# Patient Record
Sex: Female | Born: 1960 | Race: White | Hispanic: No | Marital: Married | Smoking: Never smoker
Health system: Southern US, Community
[De-identification: ages and names within clinical notes are randomized; demographics above are authoritative.]

## PROBLEM LIST (undated history)

## (undated) DIAGNOSIS — E039 Hypothyroidism, unspecified: Secondary | ICD-10-CM

## (undated) DIAGNOSIS — E038 Other specified hypothyroidism: Secondary | ICD-10-CM

## (undated) DIAGNOSIS — E538 Deficiency of other specified B group vitamins: Secondary | ICD-10-CM

## (undated) DIAGNOSIS — E7800 Pure hypercholesterolemia, unspecified: Secondary | ICD-10-CM

## (undated) DIAGNOSIS — D649 Anemia, unspecified: Secondary | ICD-10-CM

## (undated) DIAGNOSIS — M8580 Other specified disorders of bone density and structure, unspecified site: Secondary | ICD-10-CM

## (undated) DIAGNOSIS — G43909 Migraine, unspecified, not intractable, without status migrainosus: Secondary | ICD-10-CM

## (undated) DIAGNOSIS — I6529 Occlusion and stenosis of unspecified carotid artery: Secondary | ICD-10-CM

## (undated) DIAGNOSIS — J309 Allergic rhinitis, unspecified: Secondary | ICD-10-CM

## (undated) DIAGNOSIS — B009 Herpesviral infection, unspecified: Secondary | ICD-10-CM

## (undated) HISTORY — DX: Pure hypercholesterolemia, unspecified: E78.00

## (undated) HISTORY — DX: Anemia, unspecified: D64.9

## (undated) HISTORY — DX: Herpesviral infection, unspecified: B00.9

## (undated) HISTORY — DX: Other specified disorders of bone density and structure, unspecified site: M85.80

## (undated) HISTORY — DX: Migraine, unspecified, not intractable, without status migrainosus: G43.909

## (undated) HISTORY — DX: Other specified hypothyroidism: E03.8

## (undated) HISTORY — PX: MANDIBLE FRACTURE SURGERY: SHX706

## (undated) HISTORY — DX: Allergic rhinitis, unspecified: J30.9

## (undated) HISTORY — DX: Occlusion and stenosis of unspecified carotid artery: I65.29

## (undated) HISTORY — DX: Deficiency of other specified B group vitamins: E53.8

---
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
## 1998-07-16 ENCOUNTER — Other Ambulatory Visit: Admission: RE | Admit: 1998-07-16 | Discharge: 1998-07-16 | Payer: Self-pay | Admitting: Gynecology

## 1999-10-21 ENCOUNTER — Ambulatory Visit (HOSPITAL_COMMUNITY): Admission: RE | Admit: 1999-10-21 | Discharge: 1999-10-21 | Payer: Self-pay | Admitting: Obstetrics and Gynecology

## 1999-10-21 ENCOUNTER — Encounter: Payer: Self-pay | Admitting: Obstetrics and Gynecology

## 2000-12-16 ENCOUNTER — Other Ambulatory Visit: Admission: RE | Admit: 2000-12-16 | Discharge: 2000-12-16 | Payer: Self-pay | Admitting: Obstetrics and Gynecology

## 2003-11-21 ENCOUNTER — Ambulatory Visit (HOSPITAL_COMMUNITY): Admission: RE | Admit: 2003-11-21 | Discharge: 2003-11-21 | Payer: Self-pay | Admitting: Obstetrics and Gynecology

## 2003-11-27 ENCOUNTER — Other Ambulatory Visit: Admission: RE | Admit: 2003-11-27 | Discharge: 2003-11-27 | Payer: Self-pay | Admitting: Obstetrics and Gynecology

## 2004-11-27 ENCOUNTER — Other Ambulatory Visit: Admission: RE | Admit: 2004-11-27 | Discharge: 2004-11-27 | Payer: Self-pay | Admitting: Obstetrics and Gynecology

## 2005-10-02 ENCOUNTER — Encounter: Admission: RE | Admit: 2005-10-02 | Discharge: 2005-10-02 | Payer: Self-pay | Admitting: Obstetrics and Gynecology

## 2005-11-28 ENCOUNTER — Other Ambulatory Visit: Admission: RE | Admit: 2005-11-28 | Discharge: 2005-11-28 | Payer: Self-pay | Admitting: Obstetrics and Gynecology

## 2007-01-05 ENCOUNTER — Other Ambulatory Visit: Admission: RE | Admit: 2007-01-05 | Discharge: 2007-01-05 | Payer: Self-pay | Admitting: Obstetrics and Gynecology

## 2007-01-11 ENCOUNTER — Ambulatory Visit (HOSPITAL_COMMUNITY): Admission: RE | Admit: 2007-01-11 | Discharge: 2007-01-11 | Payer: Self-pay | Admitting: Obstetrics and Gynecology

## 2008-01-04 ENCOUNTER — Ambulatory Visit: Payer: Self-pay | Admitting: Obstetrics and Gynecology

## 2008-01-10 ENCOUNTER — Ambulatory Visit: Payer: Self-pay | Admitting: Obstetrics and Gynecology

## 2008-01-10 ENCOUNTER — Other Ambulatory Visit: Admission: RE | Admit: 2008-01-10 | Discharge: 2008-01-10 | Payer: Self-pay | Admitting: Obstetrics and Gynecology

## 2008-01-10 ENCOUNTER — Encounter: Payer: Self-pay | Admitting: Obstetrics and Gynecology

## 2008-02-16 ENCOUNTER — Ambulatory Visit: Payer: Self-pay | Admitting: Cardiology

## 2008-06-28 ENCOUNTER — Encounter (INDEPENDENT_AMBULATORY_CARE_PROVIDER_SITE_OTHER): Payer: Self-pay | Admitting: *Deleted

## 2009-07-06 ENCOUNTER — Encounter: Admission: RE | Admit: 2009-07-06 | Discharge: 2009-07-06 | Payer: Self-pay | Admitting: Obstetrics and Gynecology

## 2009-07-31 ENCOUNTER — Encounter: Payer: Self-pay | Admitting: Cardiology

## 2009-07-31 ENCOUNTER — Other Ambulatory Visit: Admission: RE | Admit: 2009-07-31 | Discharge: 2009-07-31 | Payer: Self-pay | Admitting: Obstetrics and Gynecology

## 2009-07-31 ENCOUNTER — Ambulatory Visit: Payer: Self-pay | Admitting: Obstetrics and Gynecology

## 2009-09-04 ENCOUNTER — Encounter (INDEPENDENT_AMBULATORY_CARE_PROVIDER_SITE_OTHER): Payer: Self-pay | Admitting: *Deleted

## 2009-09-04 ENCOUNTER — Ambulatory Visit: Payer: Self-pay | Admitting: Obstetrics and Gynecology

## 2009-09-06 ENCOUNTER — Ambulatory Visit: Payer: Self-pay | Admitting: Internal Medicine

## 2009-09-18 ENCOUNTER — Ambulatory Visit: Payer: Self-pay | Admitting: Internal Medicine

## 2010-03-12 NOTE — Letter (Signed)
Summary: [facility] Instructions  [facility]  [address] [place], [place] [address]   Phone: [phone]  Fax: [phone]       Linda Mccormick    [birth year]    MRN: [id number]       Procedure Day /Date:  Tuesday 09/18/2009     Arrival Time: 9:30 am     Procedure Time: 10:30 am     Location of Procedure:                    _x _  [facility] (4th Floor)    PREPARATION FOR COLONOSCOPY WITH MIRALAX  Starting 5 days prior to your procedure Thursday 8/4 do not eat nuts, seeds, popcorn, corn, beans, peas,  salads, or any raw vegetables.  Do not take any fiber supplements (e.g. Metamucil, Citrucel, and Benefiber). ____________________________________________________________________________________________________   THE DAY BEFORE YOUR PROCEDURE         DATE: Monday 8/8  1   Drink clear liquids the entire day-NO SOLID FOOD  2   Do not drink anything colored red or purple.  Avoid juices with pulp.  No orange juice.  3   Drink at least 64 oz. (8 glasses) of fluid/clear liquids during the day to prevent dehydration and help the prep work efficiently.  CLEAR LIQUIDS INCLUDE: Water Jello Ice Popsicles Tea (sugar ok, no milk/cream) Powdered fruit flavored drinks Coffee (sugar ok, no milk/cream) Gatorade Juice: apple, white grape, white cranberry  Lemonade Clear bullion, consomm, broth Carbonated beverages (any kind) Strained chicken noodle soup Hard Candy  4   Mix the entire bottle of Miralax with 64 oz. of Gatorade/Powerade in the morning and put in the refrigerator to chill.  5   At 3:00 pm take 2 Dulcolax/Bisacodyl tablets.  6   At 4:30 pm take one Reglan/Metoclopramide tablet.  7  Starting at 5:00 pm drink one 8 oz glass of the Miralax mixture every 15-20 minutes until you have finished drinking the entire 64 oz.  You should finish drinking prep around 7:30 or 8:00 pm.  8   If you are nauseated, you may take the 2nd Reglan/Metoclopramide  tablet at 6:30 pm.        9    At 8:00 pm take 2 more DULCOLAX/Bisacodyl tablets.     THE DAY OF YOUR PROCEDURE      DATE:  Tuesday 8/9  You may drink clear liquids until 8:30 am  (2 HOURS BEFORE PROCEDURE).   MEDICATION INSTRUCTIONS  Unless otherwise instructed, you should take regular prescription medications with a small sip of water as early as possible the morning of your procedure.           OTHER INSTRUCTIONS  You will need a responsible adult at least 52 years of age to accompany you and drive you home.   This person must remain in the waiting room during your procedure.  Wear loose fitting clothing that is easily removed.  Leave jewelry and other valuables at home.  However, you may wish to bring a book to read or an iPod/MP3 player to listen to music as you wait for your procedure to start.  Remove all body piercing jewelry and leave at home.  Total time from sign-in until discharge is approximately 2-3 hours.  You should go home directly after your procedure and rest.  You can resume normal activities the day after your procedure.  The day of your procedure you should not:   Drive  Make legal decisions   Operate machinery   Drink alcohol   Return to work  You will receive specific instructions about eating, activities and medications before you leave.   The above instructions have been reviewed and explained to me by   Ezra Sites RN  September 06, 2009 8:01 AM     I fully understand and can verbalize these instructions _____________________________ Date _______

## 2010-03-12 NOTE — Procedures (Signed)
Summary: Colonoscopy  Patient: Linda Mccormick Note: All result statuses are Final unless otherwise noted.  Tests: (1) Colonoscopy (COL)   COL Colonoscopy           DONE     [facility]     [address]     [place], [place]  [address]           COLONOSCOPY PROCEDURE REPORT           PATIENT:  Linda, Mccormick  MR#:  [id number]     BIRTHDATE:  08/26/1960, 48 yrs. old  GENDER:  female     ENDOSCOPIST:  Hedwig Morton. Juanda Chance, MD     REF. BY:  Edyth Gunnels, M.D.     PROCEDURE DATE:  09/18/2009     PROCEDURE:  Colonoscopy [address]     ASA CLASS:  Class I     INDICATIONS:  Routine Risk Screening     MEDICATIONS:   Versed 8 mg, Fentanyl 75 mcg           DESCRIPTION OF PROCEDURE:   After the risks benefits and     alternatives of the procedure were thoroughly explained, informed     consent was obtained.  Digital rectal exam was performed and     revealed no rectal masses.   The  endoscope was introduced through     the anus and advanced to the cecum, which was identified by both     the appendix and ileocecal valve, without limitations.  The     quality of the prep was good, using MiraLax.  The instrument was     then slowly withdrawn as the colon was fully examined.     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  This was     otherwise a normal examination of the colon (see image4, image3,     image2, and image1).   Retroflexed views in the rectum revealed no     abnormalities.    The scope was then withdrawn from the patient     and the procedure completed.           COMPLICATIONS:  None     ENDOSCOPIC IMPRESSION:     1) Melanosis     2) Otherwise normal examination     RECOMMENDATIONS:     1) high fiber diet     REPEAT EXAM:  In 10 year(s) for.           ______________________________     Hedwig Morton. Juanda Chance, MD           CC:           n.     eSIGNED:   Hedwig Morton. Brodie at 09/18/2009 11:42 AM           Tyrone Sage,  [id number]  Note: An exclamation mark (!) indicates a result that was not dispersed into the flowsheet. Document Creation Date: 09/18/2009 11:42 AM _______________________________________________________________________  (1) Order result status: Final Collection or observation date-time: 09/18/2009 11:33 Requested date-time:  Receipt date-time:  Reported date-time:  Referring Physician:   Ordering Physician: Lina Sar [phone]) Specimen Source:  Source: Launa Grill Order Number: [id number] Lab site:   Appended Document: Colonoscopy    Clinical Lists Changes  Observations: Added new observation of COLONNXTDUE: 09/2019 (09/18/2009 14:05)

## 2010-03-12 NOTE — Miscellaneous (Signed)
Summary: [facility]  Clinical Lists Changes  Medications: Added new medication of MIRALAX   POWD (POLYETHYLENE GLYCOL 3350) As per prep  instructions. - Signed Added new medication of DULCOLAX 5 MG  TBEC (BISACODYL) Day before procedure take 2 at 3pm and 2 at 8pm. - Signed Added new medication of REGLAN 10 MG  TABS (METOCLOPRAMIDE HCL) As per prep instructions. - Signed Rx of MIRALAX   POWD (POLYETHYLENE GLYCOL 3350) As per prep  instructions.;  #255gm x 0;  Signed;  Entered by: Ezra Sites RN;  Authorized by: Hart Carwin MD;  Method used: Electronically to [facility] [id number]*, [address], [place], [place], [place]  [address], Ph: [phone] or [phone], Fax: [phone] Rx of DULCOLAX 5 MG  TBEC (BISACODYL) Day before procedure take 2 at 3pm and 2 at 8pm.;  #4 x 0;  Signed;  Entered by: Ezra Sites RN;  Authorized by: Hart Carwin MD;  Method used: Electronically to [facility] [id number]*, [address], [place], [place], [place]  [address], Ph: [phone] or [phone], Fax: [phone] Rx of REGLAN 10 MG  TABS (METOCLOPRAMIDE HCL) As per prep instructions.;  #2 x 0;  Signed;  Entered by: Ezra Sites RN;  Authorized by: Hart Carwin MD;  Method used: Electronically to [facility] [id number]*, [address], [place], [place], [place]  [address], Ph: [phone] or [phone], Fax: [phone] Observations: Added new observation of NKA: T (09/06/2009 7:40)    Prescriptions: REGLAN 10 MG  TABS (METOCLOPRAMIDE HCL) As per prep instructions.  #2 x 0   Entered by:   Ezra Sites RN   Authorized by:   Hart Carwin MD   Signed by:   Ezra Sites RN on 09/06/2009   Method used:   Electronically to        [facility] [address] [phone]* (retail)       [address]       [place], [place]  [address]       Ph: [phone] or [phone]       Fax: [phone]   RxID:   [id number] DULCOLAX 5 MG   TBEC (BISACODYL) Day before procedure take 2 at 3pm and 2 at 8pm.  #4 x 0   Entered by:   Ezra Sites RN   Authorized by:   Hart Carwin MD   Signed by:   Ezra Sites RN on 09/06/2009   Method used:   Electronically to        [facility] [address] [phone]* (retail)       [address]       [place], [place]  [address]       Ph: [phone] or [phone]       Fax: [phone]   RxID:   [id number] MIRALAX   POWD (POLYETHYLENE GLYCOL 3350) As per prep  instructions.  #255gm x 0   Entered by:   Ezra Sites RN   Authorized by:   Hart Carwin MD   Signed by:   Ezra Sites RN on 09/06/2009   Method used:   Electronically to        [facility] [address] [phone]* (retail)       [address]       [place], [place]  [address]       Ph: [phone] or [phone]       Fax: [phone]   RxID:   [id number]

## 2010-03-14 ENCOUNTER — Ambulatory Visit (INDEPENDENT_AMBULATORY_CARE_PROVIDER_SITE_OTHER): Payer: Federal, State, Local not specified - PPO | Admitting: Women's Health

## 2010-03-14 DIAGNOSIS — N766 Ulceration of vulva: Secondary | ICD-10-CM

## 2010-06-25 NOTE — Assessment & Plan Note (Signed)
[facility]                            CARDIOLOGY OFFICE NOTE   MELAYA, HOSELTON                     MRN:          [id number]  DATE:02/16/2008                            DOB:          07/24/1960    I was asked by Dr. Edyth Gunnels to evaluate Linda Mccormick for  hyperlipidemia and family history of coronary disease.  I have taken  care of her mother.   HISTORY OF PRESENT ILLNESS:  She is 50 years of age, extremely health  conscious married white female whose most recent cholesterol was 268,  triglycerides 163, LDL was 168, HDL however was quite good at 68 with a  total cholesterol to HDL ratio of 3.9.  Her diet is remarkably good and  she rarely eats meat and has even stopped eating chicken because of the  fear of the fat.   She does yoga 3-4 times a week and cardio 1-2 times a week.  She really  does not enjoy the cardiac.  She would like to know if she can just  walk.   Her family history is really not significant and that her mother had a  heart attack in her 69s.  Her mother also had other risk factors.   PAST MEDICAL HISTORY:  She has no known dye allergies.  She has known no  known allergies to medications.   She is currently on imipramine 100 mg nightly to increase her serotonin  levels as an antidepressant, multivitamin, vitamin D, fish oil, B12  daily.  She takes Treximet 85/500 p.r.n. for migraines.  This is fairly  infrequent.   She does not smoke.  She has about 1 glass of wine a week.  She drinks  very little caffeine.   SURGICAL HISTORY:  Jaw surgery about 18 years ago.   FAMILY HISTORY:  As above.   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  It is a very large job and her recent promotion.  She really is  careful with her diet.  She is married.   REVIEW OF SYSTEMS:  Other than the HPI, she has some history of anemia  and some fatigue.  Other review of systems questioned are all  negative.   PHYSICAL EXAMINATION:  VITAL SIGNS:  She does not list a height but she  weighs 121 pounds.  Her blood pressure is 130/80, her pulse is 78 and  regular.  EKG from the outside has apparently been normal.  We did not  repeat one today.  HEENT:  Normal.  NECK:  Carotid upstrokes were equal bilateral bruits.  No JVD.  Thyroid  is not enlarged.  Trachea is midline.  LUNGS:  Clear to auscultation and percussion.  HEART:  Nondisplaced PMI.  Normal S1 and S2.  No gallop, rub, or murmur.  ABDOMEN:  Soft.  No pulsatile mass.  She does have a pulsation, but it  is narrow and she is thin.  There is no thyromegaly.  EXTREMITIES:  There was no cyanosis, clubbing, or edema.  Pulses are  brisk.  NEUROLOGIC:  Intact.  MUSCULOSKELETAL:  Intact.  SKIN:  Unremarkable.   ASSESSMENT:  1. Mixed hyperlipidemia.  She is blessed with a good HDL level and a      relatively very good cholesterol to HDL ratio.  I suspect her      triglycerides are elevated because she eats a lot of fruit and      carbohydrates in general.  2. No true premature family history of coronary disease.   I had a long talk with Kailena today.  I have recommended no  pharmacological therapy at the present time.  I have recommended that I  see her back in 3-5 years just as a followup to see if the guidelines  have changed or if her numbers have worsened.   I have advised her to walk briskly 3 hours a week.   I told her to make sure that her fish oil is at least 1000 mg a day.     Thomas C. Daleen Squibb, MD, [facility]  Electronically Signed    TCW/MedQ  DD: 02/16/2008  DT: 02/17/2008  Job #: [id number]   cc:   Reuel Boom L. Eda Paschal, M.D.  Soyla Murphy. Renne Crigler, M.D.

## 2010-09-10 ENCOUNTER — Other Ambulatory Visit: Payer: Self-pay | Admitting: Obstetrics and Gynecology

## 2010-09-10 DIAGNOSIS — Z1231 Encounter for screening mammogram for malignant neoplasm of breast: Secondary | ICD-10-CM

## 2010-09-11 ENCOUNTER — Encounter: Payer: Self-pay | Admitting: Obstetrics and Gynecology

## 2010-09-11 ENCOUNTER — Encounter: Payer: Self-pay | Admitting: Gynecology

## 2010-09-11 ENCOUNTER — Ambulatory Visit (INDEPENDENT_AMBULATORY_CARE_PROVIDER_SITE_OTHER): Payer: Federal, State, Local not specified - PPO | Admitting: Obstetrics and Gynecology

## 2010-09-11 ENCOUNTER — Other Ambulatory Visit (HOSPITAL_COMMUNITY)
Admission: RE | Admit: 2010-09-11 | Discharge: 2010-09-11 | Disposition: A | Discharge status: Home / Self Care | Payer: Federal, State, Local not specified - PPO | Source: Ambulatory Visit | Attending: Obstetrics and Gynecology | Admitting: Obstetrics and Gynecology

## 2010-09-11 DIAGNOSIS — Z0000 Encounter for general adult medical examination without abnormal findings: Secondary | ICD-10-CM

## 2010-09-11 DIAGNOSIS — Z01419 Encounter for gynecological examination (general) (routine) without abnormal findings: Secondary | ICD-10-CM | POA: Insufficient documentation

## 2010-09-11 DIAGNOSIS — R82998 Other abnormal findings in urine: Secondary | ICD-10-CM

## 2010-09-11 DIAGNOSIS — B009 Herpesviral infection, unspecified: Secondary | ICD-10-CM

## 2010-09-11 MED ORDER — VALACYCLOVIR HCL 500 MG PO TABS
500.0000 mg | ORAL_TABLET | Freq: Two times a day (BID) | ORAL | Status: AC
Start: 2010-09-11 — End: 2010-09-16

## 2010-09-11 NOTE — Progress Notes (Signed)
The patient came to see me today for an annual gynecological exam. She is not having any menopausal symptoms. She is scheduled for yearly mammogram. 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 In reviewing her history it appears that she got it from a colleague who had an active fever blister and handled her phone leaving moisture on the phone.she does have a husband who occasionally gets fever blisters. The history is not consistent with her getting up from him however on her buttock. Obviously there is some chance that he was shedding virus at a different time. Chronological he works better that she got from her Colleague.  Physical examination: HEENT within normal limits. Neck: Thyroid not large. No masses. Supraclavicular nodes: not enlarged. Breasts: Examined in both sitting midline position. No skin changes and no masses. Abdomen: Soft no guarding rebound or masses or hernia. Pelvic: External: Within normal limits. BUS: Within normal limits. Vaginal:within normal limits. Good estrogen effect. No evidence of cystocele rectocele or enterocele. Cervix: clean. Uterus: Normal size and shape. Adnexa: No masses. Rectovaginal exam: Confirmatory and negative. Extremities: Within normal limits.  Assessment: 1. HSV 1  Plan: She will call on a when necessary basis for treatment for HSV 1. She knows that she can take it daily if she gets a lot reoccurrences. So far this is not an issue. I gave her written prescription today for Valtrex 500 mg 1 twice a day for recurrence. She was given 30 pills.she is being treated for her cholesterol by her PCP with good results.

## 2010-09-11 NOTE — Progress Notes (Signed)
Addended byCammie Mcgee T on: 09/11/2010 03:12 PM   Modules accepted: Orders

## 2010-09-18 ENCOUNTER — Ambulatory Visit (HOSPITAL_COMMUNITY)
Admission: RE | Admit: 2010-09-18 | Discharge: 2010-09-18 | Disposition: A | Discharge status: Home / Self Care | Payer: Federal, State, Local not specified - PPO | Source: Ambulatory Visit | Attending: Obstetrics and Gynecology | Admitting: Obstetrics and Gynecology

## 2010-09-18 DIAGNOSIS — Z1231 Encounter for screening mammogram for malignant neoplasm of breast: Secondary | ICD-10-CM | POA: Insufficient documentation

## 2011-04-10 ENCOUNTER — Other Ambulatory Visit: Payer: Self-pay | Admitting: Dermatology

## 2012-02-11 DIAGNOSIS — M8580 Other specified disorders of bone density and structure, unspecified site: Secondary | ICD-10-CM

## 2012-02-11 HISTORY — DX: Other specified disorders of bone density and structure, unspecified site: M85.80

## 2012-02-13 ENCOUNTER — Other Ambulatory Visit: Payer: Self-pay | Admitting: Gynecology

## 2012-02-13 DIAGNOSIS — Z1231 Encounter for screening mammogram for malignant neoplasm of breast: Secondary | ICD-10-CM

## 2012-02-17 ENCOUNTER — Ambulatory Visit (HOSPITAL_COMMUNITY)
Admission: RE | Admit: 2012-02-17 | Discharge: 2012-02-17 | Disposition: A | Discharge status: Home / Self Care | Payer: Federal, State, Local not specified - PPO | Source: Ambulatory Visit | Attending: Gynecology | Admitting: Gynecology

## 2012-02-17 DIAGNOSIS — Z1231 Encounter for screening mammogram for malignant neoplasm of breast: Secondary | ICD-10-CM | POA: Insufficient documentation

## 2012-02-20 ENCOUNTER — Encounter: Payer: Self-pay | Admitting: Gynecology

## 2012-02-20 ENCOUNTER — Ambulatory Visit (INDEPENDENT_AMBULATORY_CARE_PROVIDER_SITE_OTHER): Payer: Federal, State, Local not specified - PPO | Admitting: Gynecology

## 2012-02-20 VITALS — BP 120/74 | Ht 63.75 in | Wt 112.0 lb

## 2012-02-20 DIAGNOSIS — E7800 Pure hypercholesterolemia, unspecified: Secondary | ICD-10-CM | POA: Insufficient documentation

## 2012-02-20 DIAGNOSIS — Z01419 Encounter for gynecological examination (general) (routine) without abnormal findings: Secondary | ICD-10-CM

## 2012-02-20 DIAGNOSIS — G43909 Migraine, unspecified, not intractable, without status migrainosus: Secondary | ICD-10-CM | POA: Insufficient documentation

## 2012-02-20 DIAGNOSIS — N951 Menopausal and female climacteric states: Secondary | ICD-10-CM

## 2012-02-20 NOTE — Patient Instructions (Addendum)
Tetanus, Diphtheria, Pertussis (Tdap) Vaccine What You Need to Know WHY GET VACCINATED? Tetanus, diphtheria and pertussis can be very serious diseases, even for adolescents and adults. Tdap vaccine can protect us from these diseases. TETANUS (Lockjaw) causes painful muscle tightening and stiffness, usually all over the body.  It can lead to tightening of muscles in the head and neck so you can't open your mouth, swallow, or sometimes even breathe. Tetanus kills about 1 out of 5 people who are infected. DIPHTHERIA can cause a thick coating to form in the back of the throat.  It can lead to breathing problems, paralysis, heart failure, and death. PERTUSSIS (Whooping Cough) causes severe coughing spells, which can cause difficulty breathing, vomiting and disturbed sleep.  It can also lead to weight loss, incontinence, and rib fractures. Up to 2 in 100 adolescents and 5 in 100 adults with pertussis are hospitalized or have complications, which could include pneumonia and death. These diseases are caused by bacteria. Diphtheria and pertussis are spread from person to person through coughing or sneezing. Tetanus enters the body through cuts, scratches, or wounds. Before vaccines, the [place] saw as many as 200,000 cases a year of diphtheria and pertussis, and hundreds of cases of tetanus. Since vaccination began, tetanus and diphtheria have dropped by about 99% and pertussis by about 80%. TDAP VACCINE Tdap vaccine can protect adolescents and adults from tetanus, diphtheria, and pertussis. One dose of Tdap is routinely given at age 11 or 12. People who did not get Tdap at that age should get it as soon as possible. Tdap is especially important for health care professionals and anyone having close contact with a baby younger than 12 months. Pregnant women should get a dose of Tdap during every pregnancy, to protect the newborn from pertussis. Infants are most at risk for severe, life-threatening  complications from pertussis. A similar vaccine, called Td, protects from tetanus and diphtheria, but not pertussis. A Td booster should be given every 10 years. Tdap may be given as one of these boosters if you have not already gotten a dose. Tdap may also be given after a severe cut or burn to prevent tetanus infection. Your doctor can give you more information. Tdap may safely be given at the same time as other vaccines. SOME PEOPLE SHOULD NOT GET THIS VACCINE  If you ever had a life-threatening allergic reaction after a dose of any tetanus, diphtheria, or pertussis containing vaccine, OR if you have a severe allergy to any part of this vaccine, you should not get Tdap. Tell your doctor if you have any severe allergies.  If you had a coma, or long or multiple seizures within 7 days after a childhood dose of DTP or DTaP, you should not get Tdap, unless a cause other than the vaccine was found. You can still get Td.  Talk to your doctor if you:  have epilepsy or another nervous system problem,  had severe pain or swelling after any vaccine containing diphtheria, tetanus or pertussis,  ever had Guillain-Barr Syndrome (GBS),  aren't feeling well on the day the shot is scheduled. RISKS OF A VACCINE REACTION With any medicine, including vaccines, there is a chance of side effects. These are usually mild and go away on their own, but serious reactions are also possible. Brief fainting spells can follow a vaccination, leading to injuries from falling. Sitting or lying down for about 15 minutes can help prevent these. Tell your doctor if you feel dizzy or light-headed, or   have vision changes or ringing in the ears. Mild problems following Tdap (Did not interfere with activities)  Pain where the shot was given (about 3 in 4 adolescents or 2 in 3 adults)  Redness or swelling where the shot was given (about 1 person in 5)  Mild fever of at least 100.4F (up to about 1 in 25 adolescents or 1 in  100 adults)  Headache (about 3 or 4 people in 10)  Tiredness (about 1 person in 3 or 4)  Nausea, vomiting, diarrhea, stomach ache (up to 1 in 4 adolescents or 1 in 10 adults)  Chills, body aches, sore joints, rash, swollen glands (uncommon) Moderate problems following Tdap (Interfered with activities, but did not require medical attention)  Pain where the shot was given (about 1 in 5 adolescents or 1 in 100 adults)  Redness or swelling where the shot was given (up to about 1 in 16 adolescents or 1 in 25 adults)  Fever over 102F (about 1 in 100 adolescents or 1 in 250 adults)  Headache (about 3 in 20 adolescents or 1 in 10 adults)  Nausea, vomiting, diarrhea, stomach ache (up to 1 or 3 people in 100)  Swelling of the entire arm where the shot was given (up to about 3 in 100). Severe problems following Tdap (Unable to perform usual activities, required medical attention)  Swelling, severe pain, bleeding and redness in the arm where the shot was given (rare). A severe allergic reaction could occur after any vaccine (estimated less than 1 in a million doses). WHAT IF THERE IS A SERIOUS REACTION? What should I look for?  Look for anything that concerns you, such as signs of a severe allergic reaction, very high fever, or behavior changes. Signs of a severe allergic reaction can include hives, swelling of the face and throat, difficulty breathing, a fast heartbeat, dizziness, and weakness. These would start a few minutes to a few hours after the vaccination. What should I do?  If you think it is a severe allergic reaction or other emergency that can't wait, call 9-1-1 or get the person to the nearest hospital. Otherwise, call your doctor.  Afterward, the reaction should be reported to the "Vaccine Adverse Event Reporting System" (VAERS). Your doctor might file this report, or you can do it yourself through the VAERS web site at www.vaers.hhs.gov, or by calling 1-800-822-7967. VAERS is  only for reporting reactions. They do not give medical advice.  THE NATIONAL VACCINE INJURY COMPENSATION PROGRAM The National Vaccine Injury Compensation Program (VICP) is a federal program that was created to compensate people who may have been injured by certain vaccines. Persons who believe they may have been injured by a vaccine can learn about the program and about filing a claim by calling 1-800-338-2382 or visiting the VICP website at www.hrsa.gov/vaccinecompensation. HOW CAN I LEARN MORE?  Ask your doctor.  Call your local or state health department.  Contact the Centers for Disease Control and Prevention (CDC):  Call 1-800-232-4636 or visit CDC's website at www.cdc.gov/vaccines CDC Tdap Vaccine VIS (06/19/11) Document Released: 07/29/2011 Document Reviewed: 07/29/2011 [employer] Patient Information 2013 [employer].  Breast Self-Awareness Practicing breast self-awareness may pick up problems early, prevent significant medical complications, and possibly save your life. By practicing breast self-awareness, you can become familiar with how your breasts look and feel and if your breasts are changing. This allows you to notice changes early. It can also offer you some reassurance that your breast health is good. One way to   learn what is normal for your breasts and whether your breasts are changing is to do a breast self-exam. If you find a lump or something that was not present in the past, it is best to contact your caregiver right away. Other findings that should be evaluated by your caregiver include nipple discharge, especially if it is bloody; skin changes or reddening; areas where the skin seems to be pulled in (retracted); or new lumps and bumps. Breast pain is seldom associated with cancer (malignancy), but should also be evaluated by a caregiver. BREAST SELF-EXAM The best time to examine your breasts is 5 7 days after your menstrual period is over. During menstruation, the breasts  are lumpier, and it may be more difficult to pick up changes. If you do not menstruate, have reached menopause, or had your uterus removed (hysterectomy), you should examine your breasts at regular intervals, such as monthly. If you are breastfeeding, examine your breasts after a feeding or after using a breast pump. Breast implants do not decrease the risk for lumps or tumors, so continue to perform breast self-exams as recommended. Talk to your caregiver about how to determine the difference between the implant and breast tissue. Also, talk about the amount of pressure you should use during the exam. Over time, you will become more familiar with the variations of your breasts and more comfortable with the exam. A breast self-exam requires you to remove all your clothes above the waist.   Look at your breasts and nipples. Stand in front of a mirror in a room with good lighting. With your hands on your hips, push your hands firmly downward. Look for a difference in shape, contour, and size from one breast to the other (asymmetry). Asymmetry includes puckers, dips, or bumps. Also, look for skin changes, such as reddened or scaly areas on the breasts. Look for nipple changes, such as discharge, dimpling, repositioning, or redness.  Carefully feel your breasts. This is best done either in the shower or tub while using soapy water or when flat on your back. Place the arm (on the side of the breast you are examining) above your head. Use the pads (not the fingertips) of your three middle fingers on your opposite hand to feel your breasts. Start in the underarm area and use  inch (2 cm) overlapping circles to feel your breast. Use 3 different levels of pressure (light, medium, and firm pressure) at each circle before moving to the next circle. The light pressure is needed to feel the tissue closest to the skin. The medium pressure will help to feel breast tissue a little deeper, while the firm pressure is needed to  feel the tissue close to the ribs. Continue the overlapping circles, moving downward over the breast until you feel your ribs below your breast. Then, move one finger-width towards the center of the body. Continue to use the  inch (2 cm) overlapping circles to feel your breast as you move slowly up toward the collar bone (clavicle) near the base of the neck. Continue the up and down exam using all 3 pressures until you reach the middle of the chest. Do this with each breast, carefully feeling for lumps or changes.  Keep a written record with breast changes or normal findings for each breast. By writing this information down, you do not need to depend only on memory for size, tenderness, or location. Write down where you are in your menstrual cycle, if you are still menstruating.  Breast   tissue can have some lumps or thick tissue. However, see your caregiver if you find anything that concerns you.  SEEK MEDICAL CARE IF:  You see a change in shape, contour, or size of your breasts or nipples.   You see skin changes, such as reddened or scaly areas on the breasts or nipples.   You have an unusual discharge from your nipples.   You feel a new lump or unusually thick areas.  Document Released: 01/27/2005 Document Revised: 07/29/2011 Document Reviewed: 05/14/2011 ExitCare Patient Information 2013 [employer].   

## 2012-02-20 NOTE — Progress Notes (Signed)
Linda Mccormick [birth year] [id number]   History:    52 y.o.  for annual gyn exam with no complaints today. Patient is postmenopausal. She stated that she became menopausal in her late 56s. She's having no menopausal symptoms. 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 Patient denied any recurrences. Patient been followed by Dr.Pang who has been monitoring her hypercholesterolemia and doing her lab work. Her last mammogram was a few weeks ago which was normal. She in frequently does her self breast examination. Patient with no prior history of abnormal Pap smears. Patient with benign colonoscopy at age 28. Patient's last bone density study was normal July 2011.  Past medical history,surgical history, family history and social history were all reviewed and documented in the EPIC chart.  Gynecologic History Patient's last menstrual period was 02/10/2005. Contraception: post menopausal status Last Pap: 2012. Results were: normal Last mammogram: 2014. Results were: normal  Obstetric History OB History    Grav Para Term Preterm Abortions TAB SAB Ect Mult Living   2 1 1  1  1   1      # Outc Date GA Lbr Len/2nd Wgt Sex Del Anes PTL Lv   1 TRM            2 SAB                ROS: A ROS was performed and pertinent positives and negatives are included in the history.  GENERAL: No fevers or chills. HEENT: No change in vision, no earache, sore throat or sinus congestion. NECK: No pain or stiffness. CARDIOVASCULAR: No chest pain or pressure. No palpitations. PULMONARY: No shortness of breath, cough or wheeze. GASTROINTESTINAL: No abdominal pain, nausea, vomiting or diarrhea, melena or bright red blood per rectum. GENITOURINARY: No urinary frequency, urgency, hesitancy or dysuria. MUSCULOSKELETAL: No joint or muscle pain, no back pain, no recent trauma. DERMATOLOGIC: No rash, no itching, no lesions. ENDOCRINE: No polyuria, polydipsia, no heat or cold intolerance. No recent change in weight.  HEMATOLOGICAL: No anemia or easy bruising or bleeding. NEUROLOGIC: No headache, seizures, numbness, tingling or weakness. PSYCHIATRIC: No depression, no loss of interest in normal activity or change in sleep pattern.     Exam: chaperone present  BP 120/74  Ht 5' 3.75" (1.619 m)  Wt 112 lb (50.803 kg)  BMI 19.38 kg/m2  LMP 02/10/2005  Body mass index is 19.38 kg/(m^2).  General appearance : Well developed well nourished female. No acute distress HEENT: Neck supple, trachea midline, no carotid bruits, no thyroidmegaly Lungs: Clear to auscultation, no rhonchi or wheezes, or rib retractions  Heart: Regular rate and rhythm, no murmurs or gallops Breast:Examined in sitting and supine position were symmetrical in appearance, no palpable masses or tenderness,  no skin retraction, no nipple inversion, no nipple discharge, no skin discoloration, no axillary or supraclavicular lymphadenopathy Abdomen: no palpable masses or tenderness, no rebound or guarding Extremities: no edema or skin discoloration or tenderness  Pelvic:  Bartholin, Urethra, Skene Glands: Within normal limits             Vagina: No gross lesions or discharge  Cervix: No gross lesions or discharge  Uterus  anteverted, normal size, shape and consistency, non-tender and mobile  Adnexa  Without masses or tenderness  Anus and perineum  normal   Rectovaginal  normal sphincter tone without palpated masses or tenderness             Hemoccult cards provided     Assessment/Plan:  52 y.o. female for annual exam will be scheduling her bone density study next 2 weeks. She is going to check with her primary physician at time of her lab work to see if her Tdap vaccine are up-to-date. Hemoccult cards were provided for the patient to submit to the office. Patient's flu vaccine was administered last year. We discussed importance of calcium and vitamin D and regular exercise for osteoporosis prevention.    Ok Edwards MD, 3:04 PM  02/20/2012

## 2012-02-23 ENCOUNTER — Encounter: Payer: Self-pay | Admitting: Gynecology

## 2012-03-02 ENCOUNTER — Encounter: Payer: Self-pay | Admitting: Obstetrics and Gynecology

## 2012-03-09 ENCOUNTER — Other Ambulatory Visit: Payer: Self-pay | Admitting: Anesthesiology

## 2012-03-09 ENCOUNTER — Ambulatory Visit (INDEPENDENT_AMBULATORY_CARE_PROVIDER_SITE_OTHER): Payer: Federal, State, Local not specified - PPO

## 2012-03-09 ENCOUNTER — Encounter: Payer: Self-pay | Admitting: Gynecology

## 2012-03-09 ENCOUNTER — Other Ambulatory Visit: Payer: Self-pay | Admitting: Gynecology

## 2012-03-09 DIAGNOSIS — Z1211 Encounter for screening for malignant neoplasm of colon: Secondary | ICD-10-CM

## 2012-03-09 DIAGNOSIS — M8580 Other specified disorders of bone density and structure, unspecified site: Secondary | ICD-10-CM

## 2012-03-09 DIAGNOSIS — M899 Disorder of bone, unspecified: Secondary | ICD-10-CM

## 2012-03-09 DIAGNOSIS — N951 Menopausal and female climacteric states: Secondary | ICD-10-CM

## 2012-03-10 ENCOUNTER — Encounter: Payer: Self-pay | Admitting: Anesthesiology

## 2012-03-11 ENCOUNTER — Other Ambulatory Visit: Payer: Self-pay | Admitting: *Deleted

## 2012-03-11 DIAGNOSIS — M949 Disorder of cartilage, unspecified: Secondary | ICD-10-CM

## 2012-03-12 ENCOUNTER — Other Ambulatory Visit: Payer: Federal, State, Local not specified - PPO

## 2012-03-12 DIAGNOSIS — M949 Disorder of cartilage, unspecified: Secondary | ICD-10-CM

## 2012-03-16 ENCOUNTER — Telehealth: Payer: Self-pay

## 2012-03-16 ENCOUNTER — Other Ambulatory Visit: Payer: Self-pay | Admitting: Gynecology

## 2012-03-16 DIAGNOSIS — E673 Hypervitaminosis D: Secondary | ICD-10-CM

## 2012-03-16 NOTE — Telephone Encounter (Signed)
Left message to call me.

## 2012-03-16 NOTE — Telephone Encounter (Signed)
Message copied by Keenan Bachelor on Tue Mar 16, 2012  2:39 PM ------      Message from: Ok Edwards      Created: Tue Mar 16, 2012  1:55 PM       Please inform patient that her vitamin D level is above normal. I would like for her to stop her vitamin D for the next 3 months and then come to the office to repeat a vitamin D level.

## 2012-03-16 NOTE — Telephone Encounter (Signed)
I would take only calcium 600 mg daily. After we will recheck her vitamin D level in a few months once it returned back to normal then she could take 1 Caltrate plus tablet daily or Os-Cal 1 tablet daily or Citracal one tablet daily. These contained both calcium and vitamin D (but vitamin D at a lower dose). She should continue to engage in weightbearing exercises for at least 45 minutes 3-4 times a week for osteoporosis prevention.

## 2012-03-16 NOTE — Telephone Encounter (Signed)
Patient informed re: Vit D level and to d/c Vit D x 3 mos and recheck in 3 mos.  Patient asked if there is anything she can do to build up her bones.  She said that she had decreased bone in her hips on her Dexa study and that is why these labs were being checked.  She wants to know what you recommend regarding this.

## 2012-03-19 NOTE — Telephone Encounter (Signed)
Called and left detailed voice mail with Dr. Manuela Schwartz response to her question.  I read her the paragraph below that Dr. Glenetta Hew wrote in her voice mail. Told her to call if any questions.

## 2012-07-31 ENCOUNTER — Other Ambulatory Visit: Payer: Self-pay | Admitting: Obstetrics and Gynecology

## 2013-09-12 ENCOUNTER — Ambulatory Visit (INDEPENDENT_AMBULATORY_CARE_PROVIDER_SITE_OTHER): Payer: Federal, State, Local not specified - PPO | Admitting: Gynecology

## 2013-09-12 ENCOUNTER — Encounter: Payer: Self-pay | Admitting: Gynecology

## 2013-09-12 ENCOUNTER — Other Ambulatory Visit (HOSPITAL_COMMUNITY)
Admission: RE | Admit: 2013-09-12 | Discharge: 2013-09-12 | Disposition: A | Discharge status: Home / Self Care | Payer: Federal, State, Local not specified - PPO | Source: Ambulatory Visit | Attending: Gynecology | Admitting: Gynecology

## 2013-09-12 VITALS — BP 114/70 | Ht 65.25 in | Wt 127.6 lb

## 2013-09-12 DIAGNOSIS — N951 Menopausal and female climacteric states: Secondary | ICD-10-CM

## 2013-09-12 DIAGNOSIS — Z780 Asymptomatic menopausal state: Secondary | ICD-10-CM

## 2013-09-12 DIAGNOSIS — Z01419 Encounter for gynecological examination (general) (routine) without abnormal findings: Secondary | ICD-10-CM

## 2013-09-12 DIAGNOSIS — M8580 Other specified disorders of bone density and structure, unspecified site: Secondary | ICD-10-CM

## 2013-09-12 DIAGNOSIS — M899 Disorder of bone, unspecified: Secondary | ICD-10-CM

## 2013-09-12 DIAGNOSIS — N3000 Acute cystitis without hematuria: Secondary | ICD-10-CM

## 2013-09-12 DIAGNOSIS — M949 Disorder of cartilage, unspecified: Secondary | ICD-10-CM

## 2013-09-12 DIAGNOSIS — Z1151 Encounter for screening for human papillomavirus (HPV): Secondary | ICD-10-CM | POA: Insufficient documentation

## 2013-09-12 LAB — URINALYSIS W MICROSCOPIC + REFLEX CULTURE
Bilirubin Urine: NEGATIVE
CASTS: NONE SEEN
CRYSTALS: NONE SEEN
Glucose, UA: NEGATIVE mg/dL
HGB URINE DIPSTICK: NEGATIVE
Ketones, ur: NEGATIVE mg/dL
NITRITE: POSITIVE — AB
RBC / HPF: NONE SEEN RBC/hpf (ref <3)
SPECIFIC GRAVITY, URINE: 1.015 (ref 1.005–1.030)
UROBILINOGEN UA: 1 mg/dL (ref 0.0–1.0)
pH: 6 (ref 5.0–8.0)

## 2013-09-12 MED ORDER — NITROFURANTOIN MONOHYD MACRO 100 MG PO CAPS: 100.0000 mg | ORAL_CAPSULE | Freq: Two times a day (BID) | ORAL | Status: DC

## 2013-09-12 NOTE — Addendum Note (Signed)
Addended by: Berna SpareASTILLO, BLANCA A on: 09/12/2013 04:23 PM   Modules accepted: Orders

## 2013-09-12 NOTE — Patient Instructions (Signed)
Bone Densitometry Bone densitometry is a special X-ray that measures your bone density and can be used to help predict your risk of bone fractures. This test is used to determine bone mineral content and density to diagnose osteoporosis. Osteoporosis is the loss of bone that may cause the bone to become weak. Osteoporosis commonly occurs in women entering menopause. However, it may be found in men and in people with other diseases. PREPARATION FOR TEST No preparation necessary. WHO SHOULD BE TESTED?  All women older than 85.  Postmenopausal women (50 to 11) with risk factors for osteoporosis.  People with a previous fracture caused by normal activities.  People with a small body frame (less than 127 poundsor a body mass index [BMI] of less than 21).  People who have a parent with a hip fracture or history of osteoporosis.  People who smoke.  People who have rheumatoid arthritis.  Anyone who engages in excessive alcohol use (more than 3 drinks most days).  Women who experience early menopause. WHEN SHOULD YOU BE RETESTED? Current guidelines suggest that you should wait at least 2 years before doing a bone density test again if your first test was normal.Recent studies indicated that women with normal bone density may be able to wait a few years before needing to repeat a bone density test. You should discuss this with your caregiver.  NORMAL FINDINGS   Normal: less than standard deviation below normal (greater than -1).  Osteopenia: 1 to 2.5 standard deviations below normal (-1 to -2.5).  Osteoporosis: greater than 2.5 standard deviations below normal (less than -2.5). Test results are reported as a "T score" and a "Z score."The T score is a number that compares your bone density with the bone density of healthy, young women.The Z score is a number that compares your bone density with the scores of women who are the same age, gender, and race.  Ranges for normal findings may vary  among different laboratories and hospitals. You should always check with your doctor after having lab work or other tests done to discuss the meaning of your test results and whether your values are considered within normal limits. MEANING OF TEST  Your caregiver will go over the test results with you and discuss the importance and meaning of your results, as well as treatment options and the need for additional tests if necessary. OBTAINING THE TEST RESULTS It is your responsibility to obtain your test results. Ask the lab or department performing the test when and how you will get your results. Document Released: 02/19/2004 Document Revised: 04/21/2011 Document Reviewed: 03/13/2010 [facility] Patient Information 2015 [place], [place]. This information is not intended to replace advice given to you by your health care provider. Make sure you discuss any questions you have with your health care provider. Urinary Tract Infection Urinary tract infections (UTIs) can develop anywhere along your urinary tract. Your urinary tract is your body's drainage system for removing wastes and extra water. Your urinary tract includes two kidneys, two ureters, a bladder, and a urethra. Your kidneys are a pair of bean-shaped organs. Each kidney is about the size of your fist. They are located below your ribs, one on each side of your spine. CAUSES Infections are caused by microbes, which are microscopic organisms, including fungi, viruses, and bacteria. These organisms are so small that they can only be seen through a microscope. Bacteria are the microbes that most commonly cause UTIs. SYMPTOMS  Symptoms of UTIs may vary by age and gender  of the patient and by the location of the infection. Symptoms in young women typically include a frequent and intense urge to urinate and a painful, burning feeling in the bladder or urethra during urination. Older women and men are more likely to be tired, shaky, and weak and have muscle  aches and abdominal pain. A fever may mean the infection is in your kidneys. Other symptoms of a kidney infection include pain in your back or sides below the ribs, nausea, and vomiting. DIAGNOSIS To diagnose a UTI, your caregiver will ask you about your symptoms. Your caregiver also will ask to provide a urine sample. The urine sample will be tested for bacteria and white blood cells. White blood cells are made by your body to help fight infection. TREATMENT  Typically, UTIs can be treated with medication. Because most UTIs are caused by a bacterial infection, they usually can be treated with the use of antibiotics. The choice of antibiotic and length of treatment depend on your symptoms and the type of bacteria causing your infection. HOME CARE INSTRUCTIONS  If you were prescribed antibiotics, take them exactly as your caregiver instructs you. Finish the medication even if you feel better after you have only taken some of the medication.  Drink enough water and fluids to keep your urine clear or pale yellow.  Avoid caffeine, tea, and carbonated beverages. They tend to irritate your bladder.  Empty your bladder often. Avoid holding urine for long periods of time.  Empty your bladder before and after sexual intercourse.  After a bowel movement, women should cleanse from front to back. Use each tissue only once. SEEK MEDICAL CARE IF:   You have back pain.  You develop a fever.  Your symptoms do not begin to resolve within 3 days. SEEK IMMEDIATE MEDICAL CARE IF:   You have severe back pain or lower abdominal pain.  You develop chills.  You have nausea or vomiting.  You have continued burning or discomfort with urination. MAKE SURE YOU:   Understand these instructions.  Will watch your condition.  Will get help right away if you are not doing well or get worse. Document Released: 11/06/2004 Document Revised: 07/29/2011 Document Reviewed: 03/07/2011 [facility] Patient Information  2015 [place], [place]. This information is not intended to replace advice given to you by your health care provider. Make sure you discuss any questions you have with your health care provider.

## 2013-09-12 NOTE — Progress Notes (Signed)
Linda Mccormick A Sorci 1960-05-09 [id number]   History:    53 y.o.  for annual gyn exam who stated that yesterday she began having suprapubic discomfort along with frequency and burning during urination. She had some mild low back discomfort. She denied any fever or nausea or chills or vomiting. She took one tablet from a Z-Pak 250 mg that she took and began taking also Azo to help with her spasm and feels that her today.She stated that she became menopausal in her late 7240s. She's having no menopausal symptoms. 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 Patient denied any recurrences. Patient been followed by Dr.Pang who has been monitoring her hypercholesterolemia and doing her lab work.  Patient with no prior history of abnormal Pap smears. Patient with benign colonoscopy at age 53. Patient's last bone density study was normal July 2011 which had demonstrated decreased bone mineralization osteopenic range but normal Frax analysis.   Past medical history,surgical history, family history and social history were all reviewed and documented in the EPIC chart.  Gynecologic History Patient's last menstrual period was 02/10/2005. Contraception: post menopausal status Last Pap: 2012. Results were: normal Last mammogram: 2014. Results were: normal  Obstetric History OB History  Gravida Para Term Preterm AB SAB TAB Ectopic Multiple Living  2 1 1  1 1    1     # Outcome Date GA Lbr Len/2nd Weight Sex Delivery Anes PTL Lv  2 SAB           1 TRM                ROS: A ROS was performed and pertinent positives and negatives are included in the history.  GENERAL: No fevers or chills. HEENT: No change in vision, no earache, sore throat or sinus congestion. NECK: No pain or stiffness. CARDIOVASCULAR: No chest pain or pressure. No palpitations. PULMONARY: No shortness of breath, cough or wheeze. GASTROINTESTINAL: No abdominal pain, nausea, vomiting or diarrhea, melena or bright red blood per rectum.  GENITOURINARY: No urinary frequency, urgency, hesitancy or dysuria. MUSCULOSKELETAL: No joint or muscle pain, no back pain, no recent trauma. DERMATOLOGIC: No rash, no itching, no lesions. ENDOCRINE: No polyuria, polydipsia, no heat or cold intolerance. No recent change in weight. HEMATOLOGICAL: No anemia or easy bruising or bleeding. NEUROLOGIC: No headache, seizures, numbness, tingling or weakness. PSYCHIATRIC: No depression, no loss of interest in normal activity or change in sleep pattern.     Exam: chaperone present  BP 114/70  Ht 5' 5.25" (1.657 m)  Wt 127 lb 9.6 oz (57.879 kg)  BMI 21.08 kg/m2  LMP 02/10/2005  Body mass index is 21.08 kg/(m^2).  General appearance : Well developed well nourished female. No acute distress HEENT: Neck supple, trachea midline, no carotid bruits, no thyroidmegaly Lungs: Clear to auscultation, no rhonchi or wheezes, or rib retractions  Heart: Regular rate and rhythm, no murmurs or gallops Breast:Examined in sitting and supine position were symmetrical in appearance, no palpable masses or tenderness,  no skin retraction, no nipple inversion, no nipple discharge, no skin discoloration, no axillary or supraclavicular lymphadenopathy Abdomen: no palpable masses or tenderness, no rebound or guarding Extremities: no edema or skin discoloration or tenderness  Pelvic:  Bartholin, Urethra, Skene Glands: Within normal limits             Vagina: No gross lesions or discharge, atrophic changes  Cervix: No gross lesions or discharge  Uterus  anteverted, normal size, shape and consistency, non-tender  and mobile  Adnexa  Without masses or tenderness  Anus and perineum  normal   Rectovaginal  normal sphincter tone without palpated masses or tenderness             Hemoccult cards provided   Urinalysis few bacteria, 0-WBC nitrite positive  Assessment/Plan:  53 y.o. female for annual exam with apparent urinary tract infection took one Cipro mycin 250 mg yesterday. I  am going to start her on Macrobid one by mouth twice a day for 7 days. She can take the Azo when necessary that she has at home. Her PCP has been doing her blood work. Her Pap smear was done today. She has a mammogram scheduled. I provided her with a requisition to schedule to receive her shingles vaccine. Her next bone density will be next year. Her Tdap vaccine is up to date.  Note: This dictation was prepared with  Dragon/digital dictation along withSmart phrase technology. Any transcriptional errors that result from this process are unintentional.   Ok Edwards MD, 4:05 PM 09/12/2013

## 2013-09-14 LAB — CYTOLOGY - PAP

## 2013-09-15 ENCOUNTER — Telehealth: Payer: Self-pay

## 2013-09-15 DIAGNOSIS — R899 Unspecified abnormal finding in specimens from other organs, systems and tissues: Secondary | ICD-10-CM

## 2013-09-15 NOTE — Telephone Encounter (Signed)
I left message for patient to call regarding the info below.

## 2013-09-15 NOTE — Telephone Encounter (Signed)
Message copied by Keenan BachelorANNAS, KATHERINE R on Thu Sep 15, 2013  9:57 AM ------      Message from: Ok EdwardsFERNANDEZ, JUAN H      Created: Thu Sep 15, 2013  8:43 AM       Please contact patient according to my note back in February she was supposed to return back to the office to recheck her PTH level which was elevated as well as to check her calcium and vitamin D. She was instructed to decrease her vitamin D intake during that time. She needs to come in to have these blood tests drawn and she has not done so already. ------

## 2013-09-15 NOTE — Telephone Encounter (Signed)
Patient called. Informed. Scheduled lab appt to come by tomorrow am for labs.

## 2013-09-16 ENCOUNTER — Other Ambulatory Visit: Payer: Federal, State, Local not specified - PPO

## 2013-09-16 DIAGNOSIS — R899 Unspecified abnormal finding in specimens from other organs, systems and tissues: Secondary | ICD-10-CM

## 2013-09-16 LAB — URINE CULTURE

## 2013-09-19 LAB — PTH, INTACT AND CALCIUM
CALCIUM: 9.2 mg/dL (ref 8.4–10.5)
PTH: 71.8 pg/mL (ref 14.0–72.0)

## 2013-09-21 LAB — VITAMIN D 1,25 DIHYDROXY
VITAMIN D 1, 25 (OH) TOTAL: 63 pg/mL (ref 18–72)
Vitamin D2 1, 25 (OH)2: <8 pg/mL
Vitamin D3 1, 25 (OH)2: 63 pg/mL

## 2013-11-30 ENCOUNTER — Other Ambulatory Visit: Payer: Self-pay | Admitting: Gynecology

## 2013-11-30 DIAGNOSIS — Z1231 Encounter for screening mammogram for malignant neoplasm of breast: Secondary | ICD-10-CM

## 2013-12-01 ENCOUNTER — Ambulatory Visit (HOSPITAL_COMMUNITY)
Admission: RE | Admit: 2013-12-01 | Discharge: 2013-12-01 | Disposition: A | Discharge status: Home / Self Care | Payer: Federal, State, Local not specified - PPO | Source: Ambulatory Visit | Attending: Gynecology | Admitting: Gynecology

## 2013-12-01 DIAGNOSIS — Z1231 Encounter for screening mammogram for malignant neoplasm of breast: Secondary | ICD-10-CM | POA: Insufficient documentation

## 2013-12-12 ENCOUNTER — Encounter: Payer: Self-pay | Admitting: Gynecology

## 2014-07-12 ENCOUNTER — Other Ambulatory Visit (HOSPITAL_COMMUNITY): Payer: Self-pay | Admitting: Orthodontics and Dentofacial Orthopedics

## 2014-07-12 DIAGNOSIS — S0300XA Dislocation of jaw, unspecified side, initial encounter: Secondary | ICD-10-CM

## 2014-07-24 ENCOUNTER — Ambulatory Visit (HOSPITAL_COMMUNITY): Payer: Federal, State, Local not specified - PPO

## 2014-08-02 ENCOUNTER — Ambulatory Visit (HOSPITAL_COMMUNITY)
Admission: RE | Admit: 2014-08-02 | Discharge: 2014-08-02 | Disposition: A | Discharge status: Home / Self Care | Payer: Federal, State, Local not specified - PPO | Source: Ambulatory Visit | Attending: Orthodontics and Dentofacial Orthopedics | Admitting: Orthodontics and Dentofacial Orthopedics

## 2014-08-02 DIAGNOSIS — M2662 Arthralgia of temporomandibular joint: Secondary | ICD-10-CM | POA: Diagnosis present

## 2014-08-02 DIAGNOSIS — M2540 Effusion, unspecified joint: Secondary | ICD-10-CM | POA: Diagnosis not present

## 2014-08-02 DIAGNOSIS — S030XXA Dislocation of jaw, initial encounter: Secondary | ICD-10-CM | POA: Insufficient documentation

## 2014-08-02 DIAGNOSIS — S0300XA Dislocation of jaw, unspecified side, initial encounter: Secondary | ICD-10-CM

## 2014-08-02 DIAGNOSIS — M8588 Other specified disorders of bone density and structure, other site: Secondary | ICD-10-CM | POA: Insufficient documentation

## 2014-08-02 DIAGNOSIS — X58XXXA Exposure to other specified factors, initial encounter: Secondary | ICD-10-CM | POA: Insufficient documentation

## 2014-09-01 ENCOUNTER — Encounter: Payer: Self-pay | Admitting: Internal Medicine

## 2016-04-06 ENCOUNTER — Emergency Department (HOSPITAL_COMMUNITY)
Admission: EM | Admit: 2016-04-06 | Discharge: 2016-04-06 | Disposition: A | Discharge status: Home / Self Care | Payer: Federal, State, Local not specified - PPO | Attending: Emergency Medicine | Admitting: Emergency Medicine

## 2016-04-06 ENCOUNTER — Emergency Department (HOSPITAL_COMMUNITY): Payer: Federal, State, Local not specified - PPO

## 2016-04-06 ENCOUNTER — Encounter (HOSPITAL_COMMUNITY): Payer: Self-pay | Admitting: Emergency Medicine

## 2016-04-06 DIAGNOSIS — Z79899 Other long term (current) drug therapy: Secondary | ICD-10-CM | POA: Diagnosis not present

## 2016-04-06 DIAGNOSIS — R0789 Other chest pain: Secondary | ICD-10-CM | POA: Diagnosis present

## 2016-04-06 DIAGNOSIS — R079 Chest pain, unspecified: Secondary | ICD-10-CM

## 2016-04-06 LAB — CBC
HCT: 39.7 % (ref 36.0–46.0)
Hemoglobin: 12.9 g/dL (ref 12.0–15.0)
MCH: 27.6 pg (ref 26.0–34.0)
MCHC: 32.5 g/dL (ref 30.0–36.0)
MCV: 85 fL (ref 78.0–100.0)
PLATELETS: 242 10*3/uL (ref 150–400)
RBC: 4.67 MIL/uL (ref 3.87–5.11)
RDW: 13.8 % (ref 11.5–15.5)
WBC: 5.6 10*3/uL (ref 4.0–10.5)

## 2016-04-06 LAB — BASIC METABOLIC PANEL
Anion gap: 11 (ref 5–15)
BUN: 15 mg/dL (ref 6–20)
CALCIUM: 9.8 mg/dL (ref 8.9–10.3)
CO2: 23 mmol/L (ref 22–32)
CREATININE: 0.93 mg/dL (ref 0.44–1.00)
Chloride: 104 mmol/L (ref 101–111)
GFR calc non Af Amer: >60 mL/min (ref >60)
Glucose, Bld: 96 mg/dL (ref 65–99)
Potassium: 3.7 mmol/L (ref 3.5–5.1)
Sodium: 138 mmol/L (ref 135–145)

## 2016-04-06 LAB — TROPONIN I: Troponin I: <0.03 ng/mL (ref <0.03)

## 2016-04-06 LAB — I-STAT TROPONIN, ED: TROPONIN I, POC: 0 ng/mL (ref 0.00–0.08)

## 2016-04-06 NOTE — ED Triage Notes (Signed)
Pt sts generalized CP into bilateral jaw lasting 5 min today; pt denies pain at present

## 2016-04-06 NOTE — ED Provider Notes (Signed)
[facility]-EMERGENCY DEPT Provider Note   CSN: [id number] Arrival date & time: 04/06/16  1232     History   Chief Complaint Chief Complaint  Patient presents with  . Chest Pain    HPI Linda Mccormick is a 56 y.o. female.  HPI   56 year old female presents today with complaints of chest and jaw pain.  She notes symptoms started shortly before noon today.  Patient notes an episode of generalized chest pain described as pressure with bilateral jaw pain.  She notes symptoms last approximately 5 minutes and then dissipated completely without intervention.  Patient notes she was just sitting in the car when symptoms started, no significant preceding activity or event.  Patient denies any history of the same.  Patient reports she has a history of hyperlipidemia, non-smoker, no hypertension, no drug use.  Patient notes that her mother had bypass at the age of 58.  Patient notes very minor shortness of breath associated with chest pain, that has completely resolved.  She denies any history DVT or PE or any significant risk factors.  Patient took 81 mg of aspirin prior to arrival.    Past Medical History:  Diagnosis Date  . Elevated cholesterol   . HSV-1 (herpes simplex virus 1) infection    rectal  . Migraines   . Osteopenia 02/2012   T score -1.8 FRAX 5.3%/0.6%    Patient Active Problem List   Diagnosis Date Noted  . Hypercholesterolemia 02/20/2012  . Migraine headache 02/20/2012    Past Surgical History:  Procedure Laterality Date  . MANDIBLE FRACTURE SURGERY      OB History    Gravida Para Term Preterm AB Living   2 1 1   1 1    SAB TAB Ectopic Multiple Live Births   1               Home Medications    Prior to Admission medications   Medication Sig Start Date End Date Taking? Authorizing Provider  B Complex Vitamins (B-COMPLEX/B-12 PO) Take by mouth daily.     Yes Historical Provider, MD  Cholecalciferol (VITAMIN D HIGH POTENCY) 1000 UNITS capsule Take 1,000 Units by  mouth daily.     Yes Historical Provider, MD  fish oil-omega-3 fatty acids 1000 MG capsule Take 2 g by mouth daily.     Yes Historical Provider, MD  pravastatin (PRAVACHOL) 40 MG tablet Take 40 mg by mouth daily.   Yes Historical Provider, MD  SUMAtriptan (IMITREX) 50 MG tablet Take 50 mg by mouth every 2 (two) hours as needed for migraine or headache. May repeat in 2 hours if headache persists or recurs.   Yes Historical Provider, MD  Sumatriptan-Naproxen Sodium (TREXIMET PO) Take by mouth daily.     Yes Historical Provider, MD  imipramine (TOFRANIL-PM) 100 MG capsule Take 100 mg by mouth daily.      Historical Provider, MD  Multiple Vitamin (MULTIVITAMIN) capsule Take 1 capsule by mouth daily.      Historical Provider, MD  naproxen (NAPROSYN) 500 MG tablet Take 500 mg by mouth 2 (two) times daily with a meal.    Historical Provider, MD  nitrofurantoin, macrocrystal-monohydrate, (MACROBID) 100 MG capsule Take 1 capsule (100 mg total) by mouth 2 (two) times daily. Patient not taking: Reported on 04/06/2016 09/12/13   Ok EdwardsJuan H Fernandez, MD  topiramate (TOPAMAX) 100 MG tablet Take 100 mg by mouth daily.      Historical Provider, MD  valACYclovir (VALTREX) 500 MG tablet TAKE 1  TABLET BY MOUTH 2 TIMES DAILY Patient not taking: Reported on 04/06/2016 07/31/12   Ok Edwards, MD    Family History Family History  Problem Relation Age of Onset  . Heart disease Mother   . Hyperlipidemia Mother     Social History Social History  Substance Use Topics  . Smoking status: Never Smoker  . Smokeless tobacco: Never Used  . Alcohol use Yes     Comment: rare     Allergies   Patient has no known allergies.   Review of Systems Review of Systems  All other systems reviewed and are negative.   Physical Exam Updated Vital Signs BP 130/92   Pulse 66   Temp 98 F (36.7 C) (Oral)   Resp 15   Ht 5\' 5"  (1.651 m)   Wt 56.7 kg   LMP 02/10/2005   SpO2 98%   BMI 20.80 kg/m   Physical Exam    Constitutional: She is oriented to person, place, and time. She appears well-developed and well-nourished.  HENT:  Head: Normocephalic and atraumatic.  Eyes: Conjunctivae are normal. Pupils are equal, round, and reactive to light. Right eye exhibits no discharge. Left eye exhibits no discharge. No scleral icterus.  Neck: Normal range of motion. No JVD present. No tracheal deviation present.  Cardiovascular: Normal rate, regular rhythm and normal heart sounds.   Pulmonary/Chest: Effort normal and breath sounds normal. No stridor. No respiratory distress. She has no wheezes. She has no rales. She exhibits no tenderness.  Abdominal: Soft. She exhibits no distension and no mass. There is no tenderness. There is no rebound and no guarding. No hernia.  Musculoskeletal: She exhibits no edema.  Neurological: She is alert and oriented to person, place, and time. Coordination normal.  Psychiatric: She has a normal mood and affect. Her behavior is normal. Judgment and thought content normal.  Nursing note and vitals reviewed.   ED Treatments / Results  Labs (all labs ordered are listed, but only abnormal results are displayed) Labs Reviewed  BASIC METABOLIC PANEL  CBC  TROPONIN I  I-STAT TROPOININ, ED    EKG  EKG Interpretation  Date/Time:  Sunday April 06 2016 12:41:28 EST Ventricular Rate:  92 PR Interval:  146 QRS Duration: 84 QT Interval:  348 QTC Calculation: 430 R Axis:   80 Text Interpretation:  Normal sinus rhythm with sinus arrhythmia No previous tracing Confirmed by Denton Lank  MD, Caryn Bee ([id number]) on 04/06/2016 1:26:52 PM       Radiology Dg Chest 2 View  Result Date: 04/06/2016 CLINICAL DATA:  Chest pain EXAM: CHEST  2 VIEW COMPARISON:  None. FINDINGS: The heart size and mediastinal contours are within normal limits. Both lungs are clear. The visualized skeletal structures are unremarkable. IMPRESSION: No active cardiopulmonary disease. Electronically Signed   By: Signa Kell M.D.   On: 04/06/2016 13:38    Procedures Procedures (including critical care time)  Medications Ordered in ED Medications - No data to display   Initial Impression / Assessment and Plan / ED Course  I have reviewed the triage vital signs and the nursing notes.  Pertinent labs & imaging results that were available during my care of the patient were reviewed by me and considered in my medical decision making (see chart for details).      Final Clinical Impressions(s) / ED Diagnoses   Final diagnoses:  Nonspecific chest pain    Labs: I-STAT troponin, BMP, CBC  Imaging: Chest 2 view  Consults:  Therapeutics:  Discharge Meds:   Assessment/Plan: 56 year old female presents today with complaints of chest pain.  This was brief and nature, with no lingering symptoms.  No history of the same.  Patient has a heart score of 2 with initial negative troponin.  Patient has reassuring EKG and no signs of distress.  She will have repeat troponin here with anticipated discharge home with cardiology follow-up.  She has no significant signs or symptoms of DVT or PE, no signs of dissection, no history of GERD.  Patient's delta troponin normal, she has remained asymptomatic throughout entire stay.  She will be discharged home with strict return precautions, cardiology follow-up.  She verbalized understanding and agreement to today's plan and had no further questions or concerns.   New Prescriptions New Prescriptions   No medications on file     Eyvonne Mechanic, PA-C 04/06/16 1815    Jacalyn Lefevre, MD 04/07/16 [phone]

## 2016-04-06 NOTE — Discharge Instructions (Signed)
Please read attached information. If you experience any new or worsening signs or symptoms please return to the emergency room for evaluation. Please follow-up with cardiology specialist as discussed.

## 2016-04-07 ENCOUNTER — Encounter (INDEPENDENT_AMBULATORY_CARE_PROVIDER_SITE_OTHER): Payer: Self-pay

## 2016-04-07 ENCOUNTER — Ambulatory Visit (INDEPENDENT_AMBULATORY_CARE_PROVIDER_SITE_OTHER)
Admission: RE | Admit: 2016-04-07 | Discharge: 2016-04-07 | Disposition: A | Discharge status: Home / Self Care | Payer: Self-pay | Source: Ambulatory Visit | Attending: Internal Medicine | Admitting: Internal Medicine

## 2016-04-07 ENCOUNTER — Ambulatory Visit (INDEPENDENT_AMBULATORY_CARE_PROVIDER_SITE_OTHER): Payer: Federal, State, Local not specified - PPO | Admitting: Internal Medicine

## 2016-04-07 VITALS — BP 96/60 | HR 80 | Ht 65.75 in | Wt 125.0 lb

## 2016-04-07 DIAGNOSIS — Z87898 Personal history of other specified conditions: Secondary | ICD-10-CM | POA: Diagnosis not present

## 2016-04-07 NOTE — Patient Instructions (Signed)
Your physician recommends that you continue on your current medications as directed. Please refer to the Current Medication list given to you today.  Schedule a calcium score CT scan at checkout today.  The out of pocket cost is $150.     We will contact you with results once reviewed by Dr. Tenny Crawoss.

## 2016-04-07 NOTE — Progress Notes (Signed)
Cardiology Office Note   Date:  04/07/2016   ID:  Linda Mccormick, DOB 1960-12-13, MRN [id number]  PCP:  Pearson GrippeJames Kim, MD  Cardiologist:   Dietrich PatesPaula Woodroe Vogan, MD    Referred for CP     History of Present Illness: Linda Mccormick is a 56 y.o. female with a history of chest pain.  Seen in ER on 2/25   Pt went out with husband yesterday morning  Felt fine    Sitting in car had pain in chest   PRessure  Pt felt like she would feel better leaning forward  Got out of car  Got coffee  Went back and  sat in car  Pain went tobilateral jaw Was at 11 AM [place] [employer]  Sat in car while husband got asprin  Took ASA and drank water  Spell lasted about 10 min  Intense Husband called ER physcician who told him to bring wife in    Discomfort gone  [place] [employer]  Walked around  [place]  Breathing was OK But went to ER to get checked  Had not had breakfast  Does not get reflux  At night if eats lettuce has gas  Didn't have that the night before   Exercises regularly  Tries to walk  Walks on treadmill for 2 miles  About 3.5 miles per hour  No change in ability to do this  Today feels OK          Current Meds  Medication Sig  . B Complex Vitamins (B-COMPLEX/B-12 PO) Take by mouth daily.    . Cholecalciferol (VITAMIN D HIGH POTENCY) 1000 UNITS capsule Take 1,000 Units by mouth daily.    . fish oil-omega-3 fatty acids 1000 MG capsule Take 2 g by mouth daily.    . Multiple Vitamin (MULTIVITAMIN) capsule Take 1 capsule by mouth daily.    . pravastatin (PRAVACHOL) 40 MG tablet Take 40 mg by mouth daily.  . SUMAtriptan (IMITREX) 50 MG tablet Take 50 mg by mouth every 2 (two) hours as needed for migraine or headache. May repeat in 2 hours if headache persists or recurs.     Allergies:   Patient has no known allergies.   Past Medical History:  Diagnosis Date  . Elevated cholesterol   . HSV-1 (herpes simplex virus 1) infection    rectal  . Migraines   . Osteopenia 02/2012   T score -1.8 FRAX  5.3%/0.6%    Past Surgical History:  Procedure Laterality Date  . MANDIBLE FRACTURE SURGERY       Social History:  The patient  reports that she has never smoked. She has never used smokeless tobacco. She reports that she drinks alcohol. She reports that she does not use drugs.   Family History:  The patient's family history includes Heart disease in her mother; Hyperlipidemia in her mother.  Mother was 7065  Other maternal relatives with CAD     ROS:  Please see the history of present illness. All other systems are reviewed and  Negative to the above problem except as noted.    PHYSICAL EXAM: VS:  BP 96/60   Pulse 80   Ht 5' 5.75" (1.67 m)   Wt 125 lb (56.7 kg)   LMP 02/10/2005   BMI 20.33 kg/m   GEN: Well nourished, well developed, in no acute distress  HEENT: normal  Neck: no JVD, carotid bruits, or masses Cardiac: RRR; no murmurs, rubs, or gallops,no edema  Respiratory:  clear to auscultation bilaterally, normal work of breathing GI: soft, nontender, nondistended, + BS  No hepatomegaly  MS: no deformity Moving all extremities   Skin: warm and dry, no rash Neuro:  Strength and sensation are intact Psych: euthymic mood, full affect   EKG:  EKG is ordered today.   Lipid Panel No results found for: CHOL, TRIG, HDL, CHOLHDL, VLDL, LDLCALC, LDLDIRECT    Wt Readings from Last 3 Encounters:  04/07/16 125 lb (56.7 kg)  04/06/16 125 lb (56.7 kg)  08/02/14 125 lb (56.7 kg)      ASSESSMENT AND PLAN: 1  CP  SPell concerning but not typical   Has had no recurrence since yesterday She does have a GI history  Has to chew food into very small pieces   Says that she can feel bread go down her esophagus.  No known stricture She does have a FHx of CAD, her mother in 48s     What I recomm now is a CT calcium score  If signif plaquing then I would recomm stress testing  If O or very low, consider GI eval(swallow).  CA score will allow for therapeutic goals for lipids     Current medicines are reviewed at length with the patient today.  The patient does not have concerns regarding medicines.  Signed, Dietrich Pates, MD  04/07/2016 12:29 PM    [facility] [address] [place], [place], [place]  [address] Phone: [phone]; Fax: [phone]

## 2016-06-25 ENCOUNTER — Encounter: Payer: Self-pay | Admitting: Gynecology

## 2019-01-26 ENCOUNTER — Telehealth: Payer: Self-pay | Admitting: Gastroenterology

## 2019-01-26 ENCOUNTER — Encounter: Payer: Self-pay | Admitting: Gastroenterology

## 2019-01-26 NOTE — Telephone Encounter (Signed)
Hi Dr. Fuller Plan, this pt is being referred by her PCP to you for anemia. She used to be Dr. Nichola Sizer pt. She would like to continue her GI care with you because her PCP has spoken highly about you. Is it ok to schedule patient with you? Thank you.

## 2019-01-26 NOTE — Telephone Encounter (Signed)
Consult scheduled on 03/08/19.

## 2019-01-26 NOTE — Telephone Encounter (Signed)
OK 

## 2019-02-01 ENCOUNTER — Other Ambulatory Visit: Payer: Self-pay | Admitting: Internal Medicine

## 2019-02-01 DIAGNOSIS — I6529 Occlusion and stenosis of unspecified carotid artery: Secondary | ICD-10-CM

## 2019-02-08 ENCOUNTER — Other Ambulatory Visit: Payer: Self-pay | Admitting: Internal Medicine

## 2019-02-10 ENCOUNTER — Ambulatory Visit
Admission: RE | Admit: 2019-02-10 | Discharge: 2019-02-10 | Disposition: A | Discharge status: Home / Self Care | Payer: Federal, State, Local not specified - PPO | Source: Ambulatory Visit | Attending: Internal Medicine | Admitting: Internal Medicine

## 2019-02-10 ENCOUNTER — Other Ambulatory Visit: Payer: Self-pay

## 2019-02-10 DIAGNOSIS — I6529 Occlusion and stenosis of unspecified carotid artery: Secondary | ICD-10-CM

## 2019-02-10 MED ORDER — IOPAMIDOL (ISOVUE-370) INJECTION 76%
75.0000 mL | Freq: Once | INTRAVENOUS | Status: AC | PRN
  Administered 2019-02-10: 75 mL via INTRAVENOUS

## 2019-03-08 ENCOUNTER — Ambulatory Visit: Payer: Federal, State, Local not specified - PPO | Admitting: Gastroenterology

## 2019-04-01 ENCOUNTER — Encounter: Payer: Self-pay | Admitting: Gastroenterology

## 2019-04-01 ENCOUNTER — Ambulatory Visit: Payer: Federal, State, Local not specified - PPO | Admitting: Gastroenterology

## 2019-04-01 ENCOUNTER — Other Ambulatory Visit (INDEPENDENT_AMBULATORY_CARE_PROVIDER_SITE_OTHER): Payer: Federal, State, Local not specified - PPO

## 2019-04-01 VITALS — BP 116/70 | HR 72 | Temp 97.3°F | Ht 65.75 in | Wt 130.0 lb

## 2019-04-01 DIAGNOSIS — D649 Anemia, unspecified: Secondary | ICD-10-CM

## 2019-04-01 DIAGNOSIS — Z1212 Encounter for screening for malignant neoplasm of rectum: Secondary | ICD-10-CM | POA: Diagnosis not present

## 2019-04-01 DIAGNOSIS — Z01818 Encounter for other preprocedural examination: Secondary | ICD-10-CM

## 2019-04-01 DIAGNOSIS — Z1211 Encounter for screening for malignant neoplasm of colon: Secondary | ICD-10-CM

## 2019-04-01 LAB — IBC + FERRITIN
Ferritin: 82.4 ng/mL (ref 10.0–291.0)
Iron: 59 ug/dL (ref 42–145)
Saturation Ratios: 16.4 % — ABNORMAL LOW (ref 20.0–50.0)
Transferrin: 257 mg/dL (ref 212.0–360.0)

## 2019-04-01 LAB — VITAMIN B12: Vitamin B-12: 605 pg/mL (ref 211–911)

## 2019-04-01 LAB — IGA: IgA: 221 mg/dL (ref 68–378)

## 2019-04-01 MED ORDER — SUTAB 1479-225-188 MG PO TABS: 1.0000 | ORAL_TABLET | Freq: Once | ORAL | 0 refills | Status: AC

## 2019-04-01 NOTE — Progress Notes (Signed)
0   History of Present Illness: This is a 59 year old female referred by Pearson Grippe, MD for the evaluation of anemia.  CBC in early December hemoglobin=11.2, hematocrit=34.4, MCV=86.9, B12=863. She relates a history of iron deficiency anemia and B12 deficiency in the past.  She takes B12 daily.  Prior colonoscopy performed for CRC screening with Dr. 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  No gastrointestinal complaints. Denies weight loss, abdominal pain, constipation, diarrhea, change in stool caliber, melena, hematochezia, nausea, vomiting, dysphagia, reflux symptoms, chest pain.    No Known Allergies Outpatient Medications Prior to Visit  Medication Sig Dispense Refill  . B Complex Vitamins (B-COMPLEX/B-12 PO) Take by mouth daily.      . Cholecalciferol (VITAMIN D HIGH POTENCY) 1000 UNITS capsule Take 1,000 Units by mouth daily.      . fish oil-omega-3 fatty acids 1000 MG capsule Take 2 g by mouth daily.      . Multiple Vitamin (MULTIVITAMIN) capsule Take 1 capsule by mouth daily.      . pravastatin (PRAVACHOL) 40 MG tablet Take 40 mg by mouth daily.    . SUMAtriptan (IMITREX) 50 MG tablet Take 50 mg by mouth every 2 (two) hours as needed for migraine or headache. May repeat in 2 hours if headache persists or recurs.     No facility-administered medications prior to visit.   Past Medical History:  Diagnosis Date  . Allergic rhinitis   . Anemia   . Carotid stenosis   . Elevated cholesterol   . HSV-1 (herpes simplex virus 1) infection    rectal  . Migraines   . Osteopenia 02/2012   T score -1.8 FRAX 5.3%/0.6%  . Subclinical hypothyroidism   . Vitamin B12 deficiency    Past Surgical History:  Procedure Laterality Date  . MANDIBLE FRACTURE SURGERY     Social History   Socioeconomic History  . Marital status: Married    Spouse name: Not on file  . Number of children: Not on file  . Years of education: Not on file  . Highest education  level: Not on file  Occupational History  . Not on file  Tobacco Use  . Smoking status: Never Smoker  . Smokeless tobacco: Never Used  Substance and Sexual Activity  . Alcohol use: Yes    Comment: rare  . Drug use: No  . Sexual activity: Yes    Birth control/protection: Post-menopausal  Other Topics Concern  . Not on file  Social History Narrative  . Not on file   Social Determinants of Health   Financial Resource Strain:   . Difficulty of Paying Living Expenses: Not on file  Food Insecurity:   . Worried About Programme researcher, broadcasting/film/video in the Last Year: Not on file  . Ran Out of Food in the Last Year: Not on file  Transportation Needs:   . Lack of Transportation (Medical): Not on file  . Lack of Transportation (Non-Medical): Not on file  Physical Activity:   . Days of Exercise per Week: Not on file  . Minutes of Exercise per Session: Not on file  Stress:   . Feeling of Stress : Not on file  Social Connections:   . Frequency of Communication with Friends and Family: Not on file  . Frequency of Social Gatherings with Friends and Family: Not on file  . Attends Religious Services: Not on file  . Active Member of Clubs or Organizations: Not on file  . Attends  Club or Organization Meetings: Not on file  . Marital Status: Not on file   Family History  Problem Relation Age of Onset  . Heart disease Mother   . Hyperlipidemia Mother   . Hyperlipidemia Brother       Review of Systems: Pertinent positive and negative review of systems were noted in the above HPI section. All other review of systems were otherwise negative.    Physical Exam: General: Well developed, well nourished, no acute distress Head: Normocephalic and atraumatic Eyes:  sclerae anicteric, EOMI Ears: Normal auditory acuity Mouth: No deformity or lesions Neck: Supple, no masses or thyromegaly Lungs: Clear throughout to auscultation Heart: Regular rate and rhythm; no murmurs, rubs or bruits Abdomen: Soft,  non tender and non distended. No masses, hepatosplenomegaly or hernias noted. Normal Bowel sounds Rectal: Deferred to colonoscopy Musculoskeletal: Symmetrical with no gross deformities  Skin: No lesions on visible extremities Pulses:  Normal pulses noted Extremities: No clubbing, cyanosis, edema or deformities noted Neurological: Alert oriented x 4, grossly nonfocal Cervical Nodes:  No significant cervical adenopathy Inguinal Nodes: No significant inguinal adenopathy Psychological:  Alert and cooperative. Normal mood and affect   Assessment and Recommendations:  1. Anemia, normocytic.  She is also due for 10-year CRC screening colonoscopy.  Obtain iron, TIBC, ferritin, folate, tTG, IgA.  If iron deficiency is noted or if tTG is elevated proceed with colonoscopy and EGD for further evaluation.  If no iron deficiency, normal tTG and normal IgA will proceed with colonoscopy only. The risks (including bleeding, perforation, infection, missed lesions, medication reactions and possible hospitalization or surgery if complications occur), benefits, and alternatives to colonoscopy with possible biopsy and possible polypectomy were discussed with the patient and they consent to proceed.  The risks (including bleeding, perforation, infection, missed lesions, medication reactions and possible hospitalization or surgery if complications occur), benefits, and alternatives to endoscopy with possible biopsy and possible dilation were discussed with the patient and they consent to proceed.      cc: Jani Gravel, MD [address] [place],   [address]

## 2019-04-01 NOTE — Patient Instructions (Signed)
Your provider has requested that you go to the basement level for lab work before leaving today. Press "B" on the elevator. The lab is located at the first door on the left as you exit the elevator.  Due to recent changes in healthcare laws, you may see the results of your imaging and laboratory studies on MyChart before your provider has had a chance to review them.  We understand that in some cases there may be results that are confusing or concerning to you. Not all laboratory results come back in the same time frame and the provider may be waiting for multiple results in order to interpret others.  Please give [place] 48 hours in order for your provider to thoroughly review all the results before contacting the office for clarification of your results.   You have been scheduled for an endoscopy and colonoscopy. Please follow the written instructions given to you at your visit today. Please pick up your prep supplies at the pharmacy within the next 1-3 days. If you use inhalers (even only as needed), please bring them with you on the day of your procedure.   Thank you for choosing me and [facility].  Venita Lick. Pleas Koch., MD., Clementeen Graham

## 2019-04-04 LAB — TISSUE TRANSGLUTAMINASE, IGA: (tTG) Ab, IgA: 1 U/mL

## 2019-04-19 ENCOUNTER — Other Ambulatory Visit: Payer: Self-pay | Admitting: Gastroenterology

## 2019-04-19 ENCOUNTER — Ambulatory Visit (INDEPENDENT_AMBULATORY_CARE_PROVIDER_SITE_OTHER): Payer: Federal, State, Local not specified - PPO

## 2019-04-19 DIAGNOSIS — Z1159 Encounter for screening for other viral diseases: Secondary | ICD-10-CM

## 2019-04-20 LAB — SARS CORONAVIRUS 2 (TAT 6-24 HRS): SARS Coronavirus 2: NEGATIVE

## 2019-04-21 ENCOUNTER — Ambulatory Visit (AMBULATORY_SURGERY_CENTER): Payer: Federal, State, Local not specified - PPO | Admitting: Gastroenterology

## 2019-04-21 ENCOUNTER — Other Ambulatory Visit: Payer: Self-pay

## 2019-04-21 ENCOUNTER — Encounter: Payer: Self-pay | Admitting: Gastroenterology

## 2019-04-21 VITALS — BP 130/50 | HR 80 | Temp 97.3°F | Resp 20 | Ht 65.0 in | Wt 130.0 lb

## 2019-04-21 DIAGNOSIS — K5730 Diverticulosis of large intestine without perforation or abscess without bleeding: Secondary | ICD-10-CM | POA: Diagnosis not present

## 2019-04-21 DIAGNOSIS — D509 Iron deficiency anemia, unspecified: Secondary | ICD-10-CM

## 2019-04-21 DIAGNOSIS — K2950 Unspecified chronic gastritis without bleeding: Secondary | ICD-10-CM | POA: Diagnosis not present

## 2019-04-21 MED ORDER — SODIUM CHLORIDE 0.9 % IV SOLN: 500.0000 mL | Freq: Once | INTRAVENOUS | Status: DC

## 2019-04-21 MED ORDER — FERROUS SULFATE 325 (65 FE) MG PO TABS: 325.0000 mg | ORAL_TABLET | Freq: Every day | ORAL | 2 refills | Status: DC

## 2019-04-21 NOTE — Op Note (Signed)
[facility] Patient Name: Linda Mccormick Procedure Date: 04/21/2019 2:38 PM MRN: [id number] Endoscopist: Meryl Dare , MD Age: 59 Referring MD:  Date of Birth: 1960-04-27 Gender: Female Account #: [id number] Procedure:                Colonoscopy Indications:              Unexplained iron deficiency anemia Medicines:                Monitored Anesthesia Care Procedure:                Pre-Anesthesia Assessment:                           - Prior to the procedure, a History and Physical                            was performed, and patient medications and                            allergies were reviewed. The patient's tolerance of                            previous anesthesia was also reviewed. The risks                            and benefits of the procedure and the sedation                            options and risks were discussed with the patient.                            All questions were answered, and informed consent                            was obtained. Prior Anticoagulants: The patient has                            taken no previous anticoagulant or antiplatelet                            agents. ASA Grade Assessment: II - A patient with                            mild systemic disease. After reviewing the risks                            and benefits, the patient was deemed in                            satisfactory condition to undergo the procedure.                           After obtaining informed consent, the colonoscope  was passed under direct vision. Throughout the                            procedure, the patient's blood pressure, pulse, and                            oxygen saturations were monitored continuously. The                            Colonoscope was introduced through the anus and                            advanced to the the cecum, identified by                            appendiceal orifice and ileocecal  valve. The                            ileocecal valve, appendiceal orifice, and rectum                            were photographed. The quality of the bowel                            preparation was good. The colonoscopy was performed                            without difficulty. The patient tolerated the                            procedure well. Scope In: 2:43:40 PM Scope Out: 2:58:13 PM Scope Withdrawal Time: 0 hours 11 minutes 20 seconds  Total Procedure Duration: 0 hours 14 minutes 33 seconds  Findings:                 The perianal and digital rectal examinations were                            normal.                           The terminal ileum appeared normal.                           A few small-mouthed diverticula were found in the                            left colon. There was no evidence of diverticular                            bleeding.                           The exam was otherwise without abnormality on  direct and retroflexion views. Complications:            No immediate complications. Estimated blood loss:                            None. Estimated Blood Loss:     Estimated blood loss: none. Impression:               - The examined portion of the ileum was normal.                           - Mild diverticulosis in the left colon.                           - The examination was otherwise normal on direct                            and retroflexion views.                           - No specimens collected. Recommendation:           - Repeat colonoscopy in 10 years for screening                            purposes.                           - Patient has a contact number available for                            emergencies. The signs and symptoms of potential                            delayed complications were discussed with the                            patient. Return to normal activities tomorrow.                             Written discharge instructions were provided to the                            patient.                           - Resume previous diet.                           - Continue present medications. Meryl Dare, MD 04/21/2019 3:11:17 PM This report has been signed electronically.

## 2019-04-21 NOTE — Progress Notes (Signed)
To PACU, VSS. Report to [email or web address] 

## 2019-04-21 NOTE — Progress Notes (Signed)
Called to room to assist during endoscopic procedure.  Patient ID and intended procedure confirmed with present staff. Received instructions for my participation in the procedure from the performing physician.  

## 2019-04-21 NOTE — Patient Instructions (Signed)
Handouts given for Gastritis and Diverticulosis.  Pick up new prescription for iron tablets today.  Await pathology results.  You don't need another colonoscopy for 10 years.  YOU HAD AN ENDOSCOPIC PROCEDURE TODAY AT THE [facility]:   Refer to the procedure report that was given to you for any specific questions about what was found during the examination.  If the procedure report does not answer your questions, please call your gastroenterologist to clarify.  If you requested that your care partner not be given the details of your procedure findings, then the procedure report has been included in a sealed envelope for you to review at your convenience later.  YOU SHOULD EXPECT: Some feelings of bloating in the abdomen. Passage of more gas than usual.  Walking can help get rid of the air that was put into your GI tract during the procedure and reduce the bloating. If you had a lower endoscopy (such as a colonoscopy or flexible sigmoidoscopy) you may notice spotting of blood in your stool or on the toilet paper. If you underwent a bowel prep for your procedure, you may not have a normal bowel movement for a few days.  Please Note:  You might notice some irritation and congestion in your nose or some drainage.  This is from the oxygen used during your procedure.  There is no need for concern and it should clear up in a day or so.  SYMPTOMS TO REPORT IMMEDIATELY:   Following lower endoscopy (colonoscopy or flexible sigmoidoscopy):  Excessive amounts of blood in the stool  Significant tenderness or worsening of abdominal pains  Swelling of the abdomen that is new, acute  Fever of 100F or higher   Following upper endoscopy (EGD)  Vomiting of blood or coffee ground material  New chest pain or pain under the shoulder blades  Painful or persistently difficult swallowing  New shortness of breath  Fever of 100F or higher  Black, tarry-looking stools  For urgent or emergent issues,  a gastroenterologist can be reached at any hour by calling [phone]. Do not use MyChart messaging for urgent concerns.    DIET:  We do recommend a small meal at first, but then you may proceed to your regular diet.  Drink plenty of fluids but you should avoid alcoholic beverages for 24 hours.  ACTIVITY:  You should plan to take it easy for the rest of today and you should NOT DRIVE or use heavy machinery until tomorrow (because of the sedation medicines used during the test).    FOLLOW UP: Our staff will call the number listed on your records 48-72 hours following your procedure to check on you and address any questions or concerns that you may have regarding the information given to you following your procedure. If we do not reach you, we will leave a message.  We will attempt to reach you two times.  During this call, we will ask if you have developed any symptoms of COVID 19. If you develop any symptoms (ie: fever, flu-like symptoms, shortness of breath, cough etc.) before then, please call [phone].  If you test positive for Covid 19 in the 2 weeks post procedure, please call and report this information to [place].    If any biopsies were taken you will be contacted by phone or by letter within the next 1-3 weeks.  Please call us at [phone] if you have not heard about the biopsies in 3 weeks.    SIGNATURES/CONFIDENTIALITY: You  and/or your care partner have signed paperwork which will be entered into your electronic medical record.  These signatures attest to the fact that that the information above on your After Visit Summary has been reviewed and is understood.  Full responsibility of the confidentiality of this discharge information lies with you and/or your care-partner.

## 2019-04-21 NOTE — Op Note (Signed)
[facility] Patient Name: Linda Mccormick Procedure Date: 04/21/2019 2:37 PM MRN: [id number] Endoscopist: Meryl Dare , MD Age: 59 Referring MD:  Date of Birth: [birth year] Gender: Female Account #: [id number] Procedure:                Upper GI endoscopy Indications:              Unexplained iron deficiency anemia Medicines:                Monitored Anesthesia Care Procedure:                Pre-Anesthesia Assessment:                           - Prior to the procedure, a History and Physical                            was performed, and patient medications and                            allergies were reviewed. The patient's tolerance of                            previous anesthesia was also reviewed. The risks                            and benefits of the procedure and the sedation                            options and risks were discussed with the patient.                            All questions were answered, and informed consent                            was obtained. Prior Anticoagulants: The patient has                            taken no previous anticoagulant or antiplatelet                            agents. ASA Grade Assessment: II - A patient with                            mild systemic disease. After reviewing the risks                            and benefits, the patient was deemed in                            satisfactory condition to undergo the procedure.                           After obtaining informed consent, the endoscope was  passed under direct vision. Throughout the                            procedure, the patient's blood pressure, pulse, and                            oxygen saturations were monitored continuously. The                            Endoscope was introduced through the mouth, and                            advanced to the second part of duodenum. The upper                            GI endoscopy was  accomplished without difficulty.                            The patient tolerated the procedure well. Scope In: Scope Out: Findings:                 The examined esophagus was normal.                           Patchy moderate inflammation characterized by                            erythema, friability and granularity was found in                            the gastric antrum. Biopsies were taken with a cold                            forceps for histology.                           The exam of the stomach was otherwise normal.                           The duodenal bulb and second portion of the                            duodenum were normal. Complications:            No immediate complications. Estimated Blood Loss:     Estimated blood loss was minimal. Impression:               - Normal esophagus.                           - Gastritis. Biopsied.                           - Normal duodenal bulb and second portion of the  duodenum. Recommendation:           - Patient has a contact number available for                            emergencies. The signs and symptoms of potential                            delayed complications were discussed with the                            patient. Return to normal activities tomorrow.                            Written discharge instructions were provided to the                            patient.                           - Resume previous diet.                           - Continue present medications.                           - FeSO4 325 mg po bid, #60, 2 refills                           - Await pathology results. [name], MD 04/21/2019 3:15:25 PM This report has been signed electronically.

## 2019-04-25 ENCOUNTER — Telehealth: Payer: Self-pay | Admitting: *Deleted

## 2019-04-25 ENCOUNTER — Telehealth: Payer: Self-pay

## 2019-04-25 NOTE — Telephone Encounter (Signed)
Left message on f/u call 

## 2019-04-25 NOTE — Telephone Encounter (Signed)
Attempted to reach patient for post-procedure f/u call. No answer. Left message that we will make another attempt to reach her again later today and for her to please not hesitate to call us if she has any questions/concerns regarding her care. 

## 2019-04-28 ENCOUNTER — Encounter: Payer: Self-pay | Admitting: Gastroenterology

## 2019-09-27 ENCOUNTER — Other Ambulatory Visit: Payer: Self-pay | Admitting: Gastroenterology

## 2019-09-27 DIAGNOSIS — D509 Iron deficiency anemia, unspecified: Secondary | ICD-10-CM

## 2019-11-15 ENCOUNTER — Telehealth: Payer: Self-pay | Admitting: Nurse Practitioner

## 2019-11-15 NOTE — Telephone Encounter (Signed)
I called Linda Mccormick to discuss Covid symptoms and the use of casirivimab/imdevimab, a monoclonal antibody infusion for those with mild to moderate Covid symptoms and at a high risk of hospitalization.    Pt is not qualified for this infusion due to lack of identified risk factors and co-morbid conditions.  Symptoms reviewed as well as criteria for ending isolation.  Symptoms reviewed that would warrant ED/Hospital evaluation as well should her condition worsen. Preventative practices reviewed. Patient verbalized understanding.    Nicolasa Ducking, NP

## 2020-12-19 ENCOUNTER — Other Ambulatory Visit: Payer: Self-pay

## 2020-12-19 ENCOUNTER — Ambulatory Visit (HOSPITAL_COMMUNITY)
Admission: RE | Admit: 2020-12-19 | Discharge: 2020-12-19 | Disposition: A | Discharge status: Home / Self Care | Payer: Federal, State, Local not specified - PPO | Source: Ambulatory Visit | Attending: Internal Medicine | Admitting: Internal Medicine

## 2020-12-19 ENCOUNTER — Other Ambulatory Visit (HOSPITAL_COMMUNITY): Payer: Self-pay | Admitting: Internal Medicine

## 2020-12-19 DIAGNOSIS — I6529 Occlusion and stenosis of unspecified carotid artery: Secondary | ICD-10-CM

## 2021-01-11 DIAGNOSIS — H04123 Dry eye syndrome of bilateral lacrimal glands: Secondary | ICD-10-CM | POA: Diagnosis not present

## 2021-01-11 DIAGNOSIS — H2513 Age-related nuclear cataract, bilateral: Secondary | ICD-10-CM | POA: Diagnosis not present

## 2021-01-11 DIAGNOSIS — H52212 Irregular astigmatism, left eye: Secondary | ICD-10-CM | POA: Diagnosis not present

## 2021-01-11 DIAGNOSIS — H18513 Endothelial corneal dystrophy, bilateral: Secondary | ICD-10-CM | POA: Diagnosis not present

## 2021-04-14 IMAGING — CT CT ANGIO NECK
2 of 3 series · 9 of 32 positions shown, 14 images · IV contrast (APPLIED)
Comparison: None.

CLINICAL DATA: Carotid stenosis

EXAM:
CT ANGIOGRAPHY NECK
TECHNIQUE: Multidetector CT imaging of the neck was performed using the
standard protocol during bolus administration of intravenous
contrast. Multiplanar CT image reconstructions and MIPs were
obtained to evaluate the vascular anatomy. Carotid stenosis
measurements (when applicable) are obtained utilizing NASCET
criteria, using the distal internal carotid diameter as the
denominator.
Creatinine was obtained on site at [HOSPITAL] at [HOSPITAL].
Results: Creatinine 1 mg/dL.
CONTRAST:  75mL [id number] IOPAMIDOL (UL9EHF-Q8A) INJECTION 76%

[Series 5: carotid angio · axial · 0.35mm/px · z∈[-290,-110]mm · 7 of 121 slices shown, 12 images]
[im 16/121  soft-tissue]
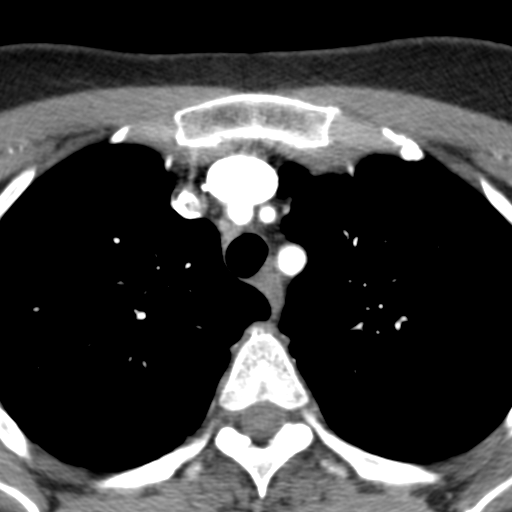
[im 16/121  bone]
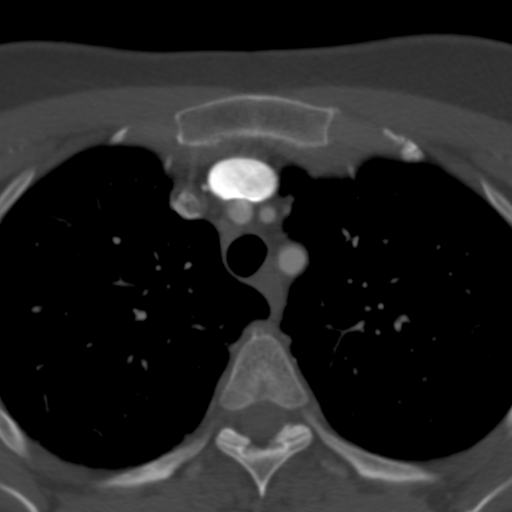
[im 31/121  soft-tissue]
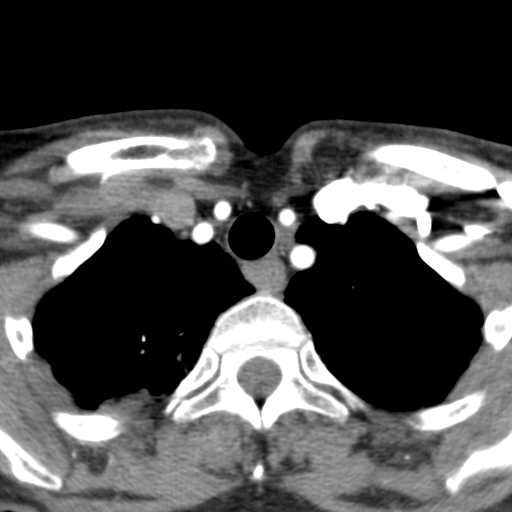
[im 46/121  soft-tissue]
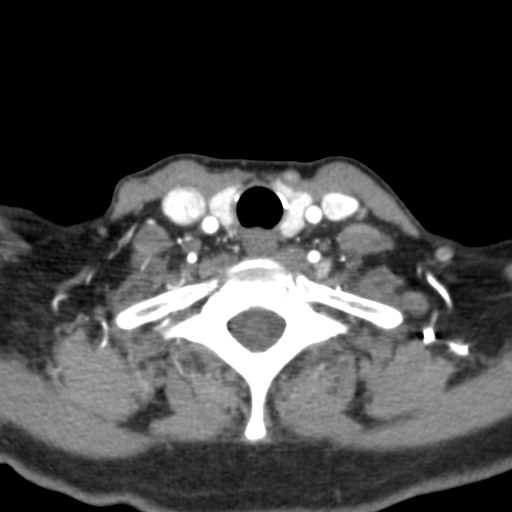
[im 61/121  soft-tissue]
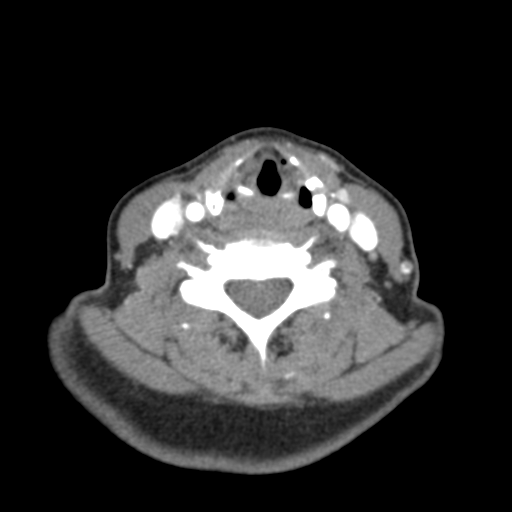
[im 61/121  lung]
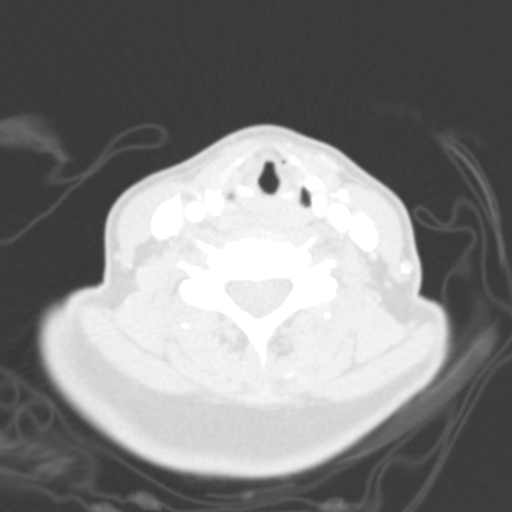
[im 76/121  soft-tissue]
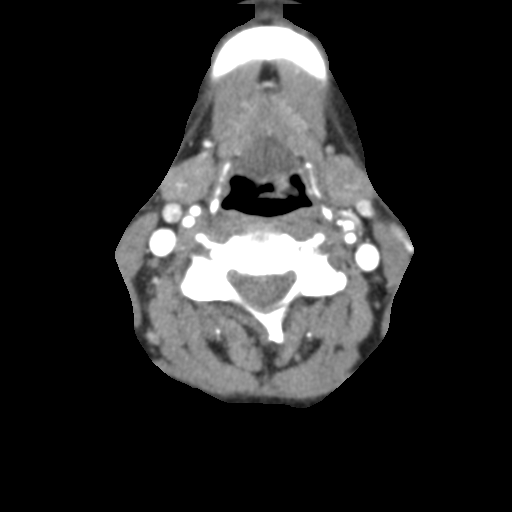
[im 76/121  lung]
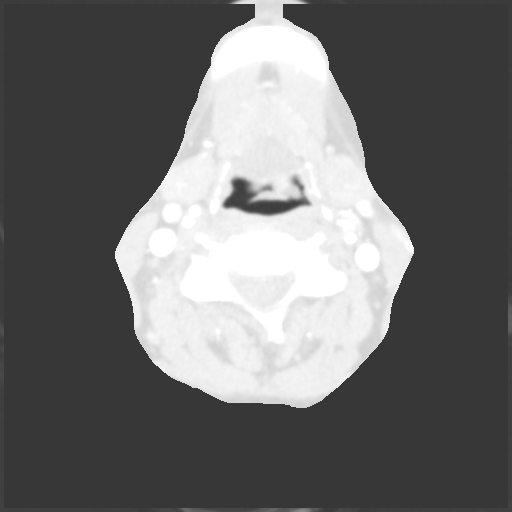
[im 91/121  soft-tissue]
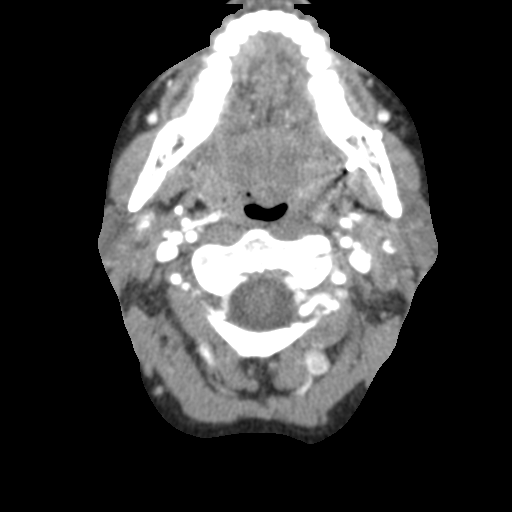
[im 91/121  lung]
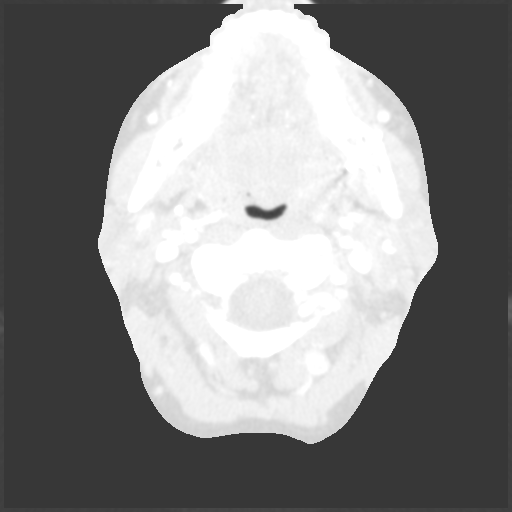
[im 106/121  soft-tissue]
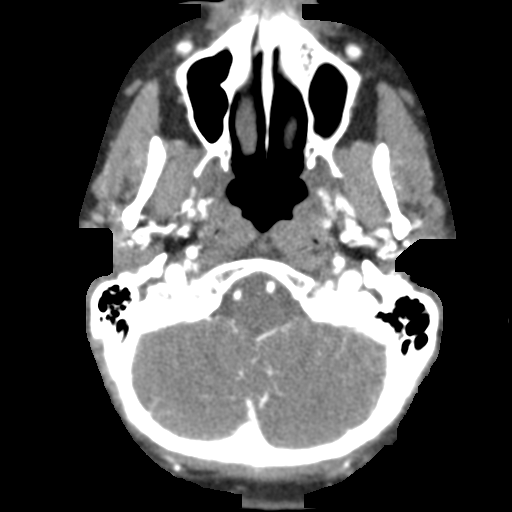
[im 106/121  lung]
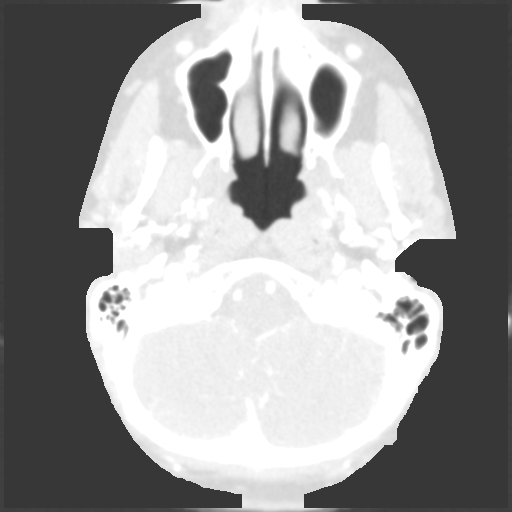

[Series 12: axial thick · axial · 0.32mm/px · z∈[-255,-172]mm · 2 of 51 slices shown]
[im 17/51  soft-tissue]
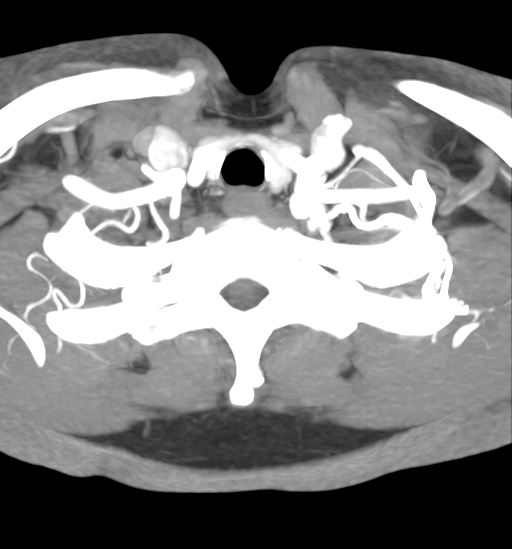
[im 34/51  soft-tissue]
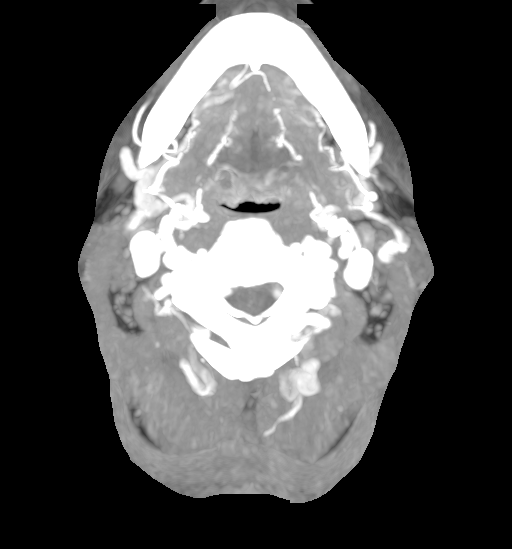

[9 of 32 positions shown; findings below may reference images not displayed]

FINDINGS: Aortic arch: Great vessel origins are patent.

Right carotid system: Patent. No measurable stenosis at the ICA
origin.

Left carotid system: Patent. There is calcified plaque at the
bifurcation and ICA origin causing less than 50% stenosis.

Vertebral arteries: Patent and codominant.

Skeleton: Mild multilevel degenerative changes of the cervical
spine. Degenerative changes of the temporomandibular joints.

Other neck: No neck mass adenopathy.

Upper chest: No apical lung mass.
IMPRESSION: Plaque at the left ICA origin causing less than 50% stenosis. No
measurable stenosis at the right ICA origin.

## 2021-07-16 DIAGNOSIS — L570 Actinic keratosis: Secondary | ICD-10-CM | POA: Diagnosis not present

## 2021-07-16 DIAGNOSIS — L814 Other melanin hyperpigmentation: Secondary | ICD-10-CM | POA: Diagnosis not present

## 2021-07-16 DIAGNOSIS — Z85828 Personal history of other malignant neoplasm of skin: Secondary | ICD-10-CM | POA: Diagnosis not present

## 2021-07-16 DIAGNOSIS — D225 Melanocytic nevi of trunk: Secondary | ICD-10-CM | POA: Diagnosis not present

## 2021-07-16 DIAGNOSIS — L821 Other seborrheic keratosis: Secondary | ICD-10-CM | POA: Diagnosis not present

## 2021-08-29 DIAGNOSIS — Z1231 Encounter for screening mammogram for malignant neoplasm of breast: Secondary | ICD-10-CM | POA: Diagnosis not present

## 2021-08-29 DIAGNOSIS — Z01419 Encounter for gynecological examination (general) (routine) without abnormal findings: Secondary | ICD-10-CM | POA: Diagnosis not present

## 2021-08-29 DIAGNOSIS — Z6821 Body mass index (BMI) 21.0-21.9, adult: Secondary | ICD-10-CM | POA: Diagnosis not present

## 2021-10-16 DIAGNOSIS — G43009 Migraine without aura, not intractable, without status migrainosus: Secondary | ICD-10-CM | POA: Diagnosis not present

## 2021-10-16 DIAGNOSIS — R0981 Nasal congestion: Secondary | ICD-10-CM | POA: Diagnosis not present

## 2021-11-12 DIAGNOSIS — H04123 Dry eye syndrome of bilateral lacrimal glands: Secondary | ICD-10-CM | POA: Diagnosis not present

## 2021-11-12 DIAGNOSIS — H2513 Age-related nuclear cataract, bilateral: Secondary | ICD-10-CM | POA: Diagnosis not present

## 2021-11-12 DIAGNOSIS — H18513 Endothelial corneal dystrophy, bilateral: Secondary | ICD-10-CM | POA: Diagnosis not present

## 2021-11-12 DIAGNOSIS — H52212 Irregular astigmatism, left eye: Secondary | ICD-10-CM | POA: Diagnosis not present

## 2021-11-29 DIAGNOSIS — Z7184 Encounter for health counseling related to travel: Secondary | ICD-10-CM | POA: Diagnosis not present

## 2021-12-23 DIAGNOSIS — M8580 Other specified disorders of bone density and structure, unspecified site: Secondary | ICD-10-CM | POA: Diagnosis not present

## 2021-12-23 DIAGNOSIS — E785 Hyperlipidemia, unspecified: Secondary | ICD-10-CM | POA: Diagnosis not present

## 2021-12-24 DIAGNOSIS — Z1339 Encounter for screening examination for other mental health and behavioral disorders: Secondary | ICD-10-CM | POA: Diagnosis not present

## 2021-12-24 DIAGNOSIS — Z0000 Encounter for general adult medical examination without abnormal findings: Secondary | ICD-10-CM | POA: Diagnosis not present

## 2021-12-24 DIAGNOSIS — Z1331 Encounter for screening for depression: Secondary | ICD-10-CM | POA: Diagnosis not present

## 2022-03-18 DIAGNOSIS — J302 Other seasonal allergic rhinitis: Secondary | ICD-10-CM | POA: Diagnosis not present

## 2022-03-18 DIAGNOSIS — J0100 Acute maxillary sinusitis, unspecified: Secondary | ICD-10-CM | POA: Diagnosis not present

## 2022-03-18 DIAGNOSIS — R059 Cough, unspecified: Secondary | ICD-10-CM | POA: Diagnosis not present

## 2022-03-18 DIAGNOSIS — R5383 Other fatigue: Secondary | ICD-10-CM | POA: Diagnosis not present

## 2022-03-18 DIAGNOSIS — Z1152 Encounter for screening for COVID-19: Secondary | ICD-10-CM | POA: Diagnosis not present

## 2022-03-18 DIAGNOSIS — J029 Acute pharyngitis, unspecified: Secondary | ICD-10-CM | POA: Diagnosis not present

## 2022-07-08 DIAGNOSIS — D225 Melanocytic nevi of trunk: Secondary | ICD-10-CM | POA: Diagnosis not present

## 2022-07-08 DIAGNOSIS — Z85828 Personal history of other malignant neoplasm of skin: Secondary | ICD-10-CM | POA: Diagnosis not present

## 2022-07-08 DIAGNOSIS — L570 Actinic keratosis: Secondary | ICD-10-CM | POA: Diagnosis not present

## 2022-07-08 DIAGNOSIS — L821 Other seborrheic keratosis: Secondary | ICD-10-CM | POA: Diagnosis not present

## 2022-07-08 DIAGNOSIS — L820 Inflamed seborrheic keratosis: Secondary | ICD-10-CM | POA: Diagnosis not present

## 2022-08-05 DIAGNOSIS — B351 Tinea unguium: Secondary | ICD-10-CM | POA: Diagnosis not present

## 2022-08-29 DIAGNOSIS — J0100 Acute maxillary sinusitis, unspecified: Secondary | ICD-10-CM | POA: Diagnosis not present

## 2022-09-02 DIAGNOSIS — B351 Tinea unguium: Secondary | ICD-10-CM | POA: Diagnosis not present

## 2022-10-07 DIAGNOSIS — Z124 Encounter for screening for malignant neoplasm of cervix: Secondary | ICD-10-CM | POA: Diagnosis not present

## 2022-10-07 DIAGNOSIS — Z01419 Encounter for gynecological examination (general) (routine) without abnormal findings: Secondary | ICD-10-CM | POA: Diagnosis not present

## 2022-10-07 DIAGNOSIS — Z1231 Encounter for screening mammogram for malignant neoplasm of breast: Secondary | ICD-10-CM | POA: Diagnosis not present

## 2022-10-07 DIAGNOSIS — Z6821 Body mass index (BMI) 21.0-21.9, adult: Secondary | ICD-10-CM | POA: Diagnosis not present

## 2022-10-07 DIAGNOSIS — Z1151 Encounter for screening for human papillomavirus (HPV): Secondary | ICD-10-CM | POA: Diagnosis not present

## 2022-11-04 DIAGNOSIS — N390 Urinary tract infection, site not specified: Secondary | ICD-10-CM | POA: Diagnosis not present

## 2022-11-04 DIAGNOSIS — R350 Frequency of micturition: Secondary | ICD-10-CM | POA: Diagnosis not present

## 2022-12-29 DIAGNOSIS — M8580 Other specified disorders of bone density and structure, unspecified site: Secondary | ICD-10-CM | POA: Diagnosis not present

## 2022-12-29 DIAGNOSIS — E785 Hyperlipidemia, unspecified: Secondary | ICD-10-CM | POA: Diagnosis not present

## 2022-12-31 DIAGNOSIS — Z1331 Encounter for screening for depression: Secondary | ICD-10-CM | POA: Diagnosis not present

## 2022-12-31 DIAGNOSIS — Z0000 Encounter for general adult medical examination without abnormal findings: Secondary | ICD-10-CM | POA: Diagnosis not present

## 2022-12-31 DIAGNOSIS — Z23 Encounter for immunization: Secondary | ICD-10-CM | POA: Diagnosis not present

## 2022-12-31 DIAGNOSIS — Z1339 Encounter for screening examination for other mental health and behavioral disorders: Secondary | ICD-10-CM | POA: Diagnosis not present

## 2023-01-06 ENCOUNTER — Other Ambulatory Visit (HOSPITAL_COMMUNITY): Payer: Self-pay | Admitting: Internal Medicine

## 2023-01-06 DIAGNOSIS — I6529 Occlusion and stenosis of unspecified carotid artery: Secondary | ICD-10-CM

## 2023-01-07 ENCOUNTER — Ambulatory Visit (HOSPITAL_COMMUNITY)
Admission: RE | Admit: 2023-01-07 | Discharge: 2023-01-07 | Disposition: A | Discharge status: Home / Self Care | Payer: Federal, State, Local not specified - PPO | Source: Ambulatory Visit | Attending: Vascular Surgery | Admitting: Vascular Surgery

## 2023-01-07 DIAGNOSIS — I6529 Occlusion and stenosis of unspecified carotid artery: Secondary | ICD-10-CM | POA: Insufficient documentation

## 2023-03-31 DIAGNOSIS — L570 Actinic keratosis: Secondary | ICD-10-CM | POA: Diagnosis not present

## 2023-06-01 DIAGNOSIS — H52212 Irregular astigmatism, left eye: Secondary | ICD-10-CM | POA: Diagnosis not present

## 2023-06-01 DIAGNOSIS — H2513 Age-related nuclear cataract, bilateral: Secondary | ICD-10-CM | POA: Diagnosis not present

## 2023-06-01 DIAGNOSIS — H524 Presbyopia: Secondary | ICD-10-CM | POA: Diagnosis not present

## 2023-06-01 DIAGNOSIS — H5202 Hypermetropia, left eye: Secondary | ICD-10-CM | POA: Diagnosis not present

## 2023-06-01 DIAGNOSIS — H04123 Dry eye syndrome of bilateral lacrimal glands: Secondary | ICD-10-CM | POA: Diagnosis not present

## 2023-06-01 DIAGNOSIS — H18513 Endothelial corneal dystrophy, bilateral: Secondary | ICD-10-CM | POA: Diagnosis not present

## 2023-06-01 DIAGNOSIS — H52221 Regular astigmatism, right eye: Secondary | ICD-10-CM | POA: Diagnosis not present

## 2023-08-06 DIAGNOSIS — L814 Other melanin hyperpigmentation: Secondary | ICD-10-CM | POA: Diagnosis not present

## 2023-08-06 DIAGNOSIS — L821 Other seborrheic keratosis: Secondary | ICD-10-CM | POA: Diagnosis not present

## 2023-08-06 DIAGNOSIS — D225 Melanocytic nevi of trunk: Secondary | ICD-10-CM | POA: Diagnosis not present

## 2023-08-06 DIAGNOSIS — Z85828 Personal history of other malignant neoplasm of skin: Secondary | ICD-10-CM | POA: Diagnosis not present

## 2023-08-25 DIAGNOSIS — H00021 Hordeolum internum right upper eyelid: Secondary | ICD-10-CM | POA: Diagnosis not present

## 2023-11-17 DIAGNOSIS — J0110 Acute frontal sinusitis, unspecified: Secondary | ICD-10-CM | POA: Diagnosis not present

## 2023-11-17 DIAGNOSIS — R051 Acute cough: Secondary | ICD-10-CM | POA: Diagnosis not present

## 2023-12-10 DIAGNOSIS — Z1231 Encounter for screening mammogram for malignant neoplasm of breast: Secondary | ICD-10-CM | POA: Diagnosis not present

## 2023-12-10 DIAGNOSIS — Z01419 Encounter for gynecological examination (general) (routine) without abnormal findings: Secondary | ICD-10-CM | POA: Diagnosis not present

## 2024-01-26 ENCOUNTER — Other Ambulatory Visit: Payer: Self-pay | Admitting: Internal Medicine

## 2024-01-26 DIAGNOSIS — E785 Hyperlipidemia, unspecified: Secondary | ICD-10-CM

## 2024-02-01 ENCOUNTER — Other Ambulatory Visit

## 2024-02-09 ENCOUNTER — Ambulatory Visit
Admission: RE | Admit: 2024-02-09 | Discharge: 2024-02-09 | Disposition: A | Discharge status: Home / Self Care | Source: Ambulatory Visit | Attending: Internal Medicine | Admitting: Internal Medicine

## 2024-02-09 DIAGNOSIS — E785 Hyperlipidemia, unspecified: Secondary | ICD-10-CM
